# Patient Record
Sex: Female | Born: 1980 | Race: White | Hispanic: No | Marital: Married | Smoking: Never smoker
Health system: Southern US, Community
[De-identification: ages and names within clinical notes are randomized; demographics above are authoritative.]

## PROBLEM LIST (undated history)

## (undated) DIAGNOSIS — T7840XA Allergy, unspecified, initial encounter: Secondary | ICD-10-CM

## (undated) DIAGNOSIS — L239 Allergic contact dermatitis, unspecified cause: Secondary | ICD-10-CM

## (undated) DIAGNOSIS — Z201 Contact with and (suspected) exposure to tuberculosis: Secondary | ICD-10-CM

## (undated) HISTORY — DX: Allergy, unspecified, initial encounter: T78.40XA

## (undated) HISTORY — DX: Contact with and (suspected) exposure to tuberculosis: Z20.1

## (undated) HISTORY — DX: Allergic contact dermatitis, unspecified cause: L23.9

---

## 2007-09-24 ENCOUNTER — Ambulatory Visit: Payer: Self-pay | Admitting: Obstetrics and Gynecology

## 2007-10-01 ENCOUNTER — Encounter: Payer: Self-pay | Admitting: Maternal & Fetal Medicine

## 2007-10-08 ENCOUNTER — Encounter: Payer: Self-pay | Admitting: Maternal & Fetal Medicine

## 2007-10-18 ENCOUNTER — Observation Stay: Payer: Self-pay | Admitting: Obstetrics and Gynecology

## 2007-10-18 ENCOUNTER — Encounter: Payer: Self-pay | Admitting: Maternal & Fetal Medicine

## 2007-10-22 ENCOUNTER — Observation Stay: Payer: Self-pay

## 2007-10-24 ENCOUNTER — Observation Stay: Payer: Self-pay | Admitting: Obstetrics and Gynecology

## 2007-10-27 ENCOUNTER — Observation Stay: Payer: Self-pay

## 2007-10-29 ENCOUNTER — Encounter: Payer: Self-pay | Admitting: Maternal & Fetal Medicine

## 2007-10-31 ENCOUNTER — Observation Stay: Payer: Self-pay

## 2007-11-03 ENCOUNTER — Observation Stay: Payer: Self-pay

## 2007-11-05 ENCOUNTER — Encounter: Payer: Self-pay | Admitting: Maternal & Fetal Medicine

## 2007-11-07 ENCOUNTER — Observation Stay: Payer: Self-pay | Admitting: Obstetrics and Gynecology

## 2007-11-10 ENCOUNTER — Observation Stay: Payer: Self-pay | Admitting: Obstetrics and Gynecology

## 2007-11-14 ENCOUNTER — Observation Stay: Payer: Self-pay

## 2007-11-17 ENCOUNTER — Observation Stay: Payer: Self-pay

## 2007-11-21 ENCOUNTER — Observation Stay: Payer: Self-pay | Admitting: Obstetrics and Gynecology

## 2007-11-24 ENCOUNTER — Inpatient Hospital Stay: Payer: Self-pay | Admitting: Obstetrics and Gynecology

## 2009-09-08 HISTORY — PX: WRIST FRACTURE SURGERY: SHX121

## 2010-06-30 ENCOUNTER — Ambulatory Visit: Payer: Self-pay | Admitting: Obstetrics and Gynecology

## 2010-09-20 ENCOUNTER — Observation Stay: Payer: Self-pay | Admitting: Obstetrics and Gynecology

## 2010-10-11 ENCOUNTER — Inpatient Hospital Stay: Payer: Self-pay

## 2011-06-17 DIAGNOSIS — E039 Hypothyroidism, unspecified: Secondary | ICD-10-CM | POA: Insufficient documentation

## 2011-06-17 DIAGNOSIS — E038 Other specified hypothyroidism: Secondary | ICD-10-CM | POA: Insufficient documentation

## 2011-06-17 DIAGNOSIS — Z8639 Personal history of other endocrine, nutritional and metabolic disease: Secondary | ICD-10-CM | POA: Insufficient documentation

## 2013-01-17 LAB — LIPID PANEL
Cholesterol: 150 mg/dL (ref 0–200)
HDL: 62 mg/dL (ref 35–70)
LDL Cholesterol: 73 mg/dL
TRIGLYCERIDES: 73 mg/dL (ref 40–160)

## 2013-01-18 LAB — HM PAP SMEAR: HM PAP: NORMAL

## 2014-09-15 ENCOUNTER — Ambulatory Visit: Payer: Self-pay | Admitting: Family Medicine

## 2015-03-03 ENCOUNTER — Encounter: Payer: Self-pay | Admitting: Family Medicine

## 2015-03-03 DIAGNOSIS — Z91018 Allergy to other foods: Secondary | ICD-10-CM | POA: Insufficient documentation

## 2015-03-03 DIAGNOSIS — L239 Allergic contact dermatitis, unspecified cause: Secondary | ICD-10-CM | POA: Insufficient documentation

## 2015-03-03 DIAGNOSIS — T7840XA Allergy, unspecified, initial encounter: Secondary | ICD-10-CM | POA: Insufficient documentation

## 2015-03-04 ENCOUNTER — Encounter (INDEPENDENT_AMBULATORY_CARE_PROVIDER_SITE_OTHER): Payer: Self-pay

## 2015-03-04 ENCOUNTER — Encounter: Payer: Self-pay | Admitting: Family Medicine

## 2015-03-04 ENCOUNTER — Ambulatory Visit (INDEPENDENT_AMBULATORY_CARE_PROVIDER_SITE_OTHER): Payer: Managed Care, Other (non HMO) | Admitting: Family Medicine

## 2015-03-04 VITALS — BP 110/60 | HR 105 | Temp 98.1°F | Resp 18 | Ht 62.25 in | Wt 203.7 lb

## 2015-03-04 DIAGNOSIS — I059 Rheumatic mitral valve disease, unspecified: Secondary | ICD-10-CM | POA: Insufficient documentation

## 2015-03-04 DIAGNOSIS — Z789 Other specified health status: Secondary | ICD-10-CM | POA: Diagnosis not present

## 2015-03-04 DIAGNOSIS — E038 Other specified hypothyroidism: Secondary | ICD-10-CM

## 2015-03-04 DIAGNOSIS — Z13220 Encounter for screening for lipoid disorders: Secondary | ICD-10-CM

## 2015-03-04 DIAGNOSIS — Z0000 Encounter for general adult medical examination without abnormal findings: Secondary | ICD-10-CM

## 2015-03-04 DIAGNOSIS — Z0282 Encounter for adoption services: Secondary | ICD-10-CM

## 2015-03-04 DIAGNOSIS — Z8639 Personal history of other endocrine, nutritional and metabolic disease: Secondary | ICD-10-CM

## 2015-03-04 DIAGNOSIS — Z124 Encounter for screening for malignant neoplasm of cervix: Secondary | ICD-10-CM | POA: Diagnosis not present

## 2015-03-04 DIAGNOSIS — Z7681 Expectant parent(s) prebirth pediatrician visit: Secondary | ICD-10-CM | POA: Diagnosis not present

## 2015-03-04 DIAGNOSIS — E039 Hypothyroidism, unspecified: Secondary | ICD-10-CM

## 2015-03-04 LAB — POCT URINALYSIS DIPSTICK
Bilirubin, UA: NEGATIVE
Blood, UA: NEGATIVE
Glucose, UA: NEGATIVE
Ketones, UA: NEGATIVE
LEUKOCYTES UA: NEGATIVE
NITRITE UA: NEGATIVE
PROTEIN UA: NEGATIVE
Spec Grav, UA: 1.01
Urobilinogen, UA: 0.2
pH, UA: 5.5

## 2015-03-04 NOTE — Progress Notes (Signed)
Name: Cynthia Villa   MRN: [id number]    DOB: Mar 30, 1981   Date:03/04/2015       Progress Note  Subjective  Chief Complaint  Chief Complaint  Patient presents with  . Annual Exam    HPI  Well Woman: she has gained weight, more stressed at work, getting ready to adopt another child and needs paperwork done.   Patient Active Problem List   Diagnosis Date Noted  . 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 History of thyroid nodule 03/04/2015  . Allergic contact dermatitis 03/03/2015  . Allergic state 03/03/2015  . Allergic to food 03/03/2015  . History of hyperthyroidism 06/17/2011  . Subclinical hypothyroidism 06/17/2011    Past Surgical History  Procedure Laterality Date  . Wrist fracture surgery Left 09/08/09    Repaired- August 2004    Family History  Problem Relation Age of Onset  . Diabetes Mother     DM-2nd to chronic pancreatitis  . Alcohol abuse Mother   . Cancer Mother     Breast  . Cholelithiasis Mother   . Sleep apnea Father   . Hypertension Father   . PKU Son   . Polycystic ovary syndrome Sister   . Cholelithiasis Sister     History   Social History  . Marital Status: Married    Spouse Name: N/A  . Number of Children: N/A  . Years of Education: N/A   Occupational History  . Not on file.   Social History Main Topics  . Smoking status: Never Smoker   . Smokeless tobacco: Never Used  . Alcohol Use: No  . Drug Use: No  . Sexual Activity:    Partners: Male   Other Topics Concern  . Not on file   Social History Narrative  . No narrative on file     Current outpatient prescriptions:  .  albuterol (ACCUNEB) 0.63 MG/3ML nebulizer solution, Inhale into the lungs as needed., Disp: , Rfl:  .  Albuterol Sulfate (PROAIR RESPICLICK) 108 (90 BASE) MCG/ACT AEPB, , Disp: , Rfl:  .  fluticasone (FLONASE) 50 MCG/ACT nasal spray, Place 2 sprays into the nose as needed., Disp: , Rfl:  .  levocetirizine (XYZAL) 5 MG tablet, Take 1 tablet by mouth daily., Disp:  , Rfl: 3 .  PARAGARD INTRAUTERINE COPPER IUD IUD, by Intrauterine route., Disp: , Rfl:  .  ranitidine (ZANTAC) 150 MG tablet, Take 1 tablet by mouth 2 (two) times daily., Disp: , Rfl: 3  Allergies  Allergen Reactions  . Cetirizine Other (See Comments)    Other Reaction: CNS Disorder  . Monistat  [Miconazole] Nausea And Vomiting  . Orange Fruit [Citrus] Swelling  . Penicillins Hives  . Sudafed  [Pseudoephedrine]     Other reaction(s): Muscle Pain  . Banana Rash  . Pineapple Rash     ROS  Constitutional: Negative for fever / positive for weight change - gain.  Respiratory: Negative for cough and shortness of breath.   Cardiovascular: Negative for chest pain or palpitations.  Gastrointestinal: Negative for abdominal pain, no bowel changes.  Musculoskeletal: Negative for gait problem or joint swelling.  Skin: Positive  for rash.  Neurological: Negative for dizziness or headache.  No other specific complaints in a complete review of systems (except as listed in HPI above).  Objective  Filed Vitals:   03/04/15 1148  BP: 110/60  Pulse: 105  Temp: 98.1 F (36.7 C)  TempSrc: Oral  Resp: 18  Height: 5' 2.25" (1.581 m)  Weight: 203  lb 11.2 oz (92.398 kg)  SpO2: 96%    Body mass index is 36.97 kg/(m^2).  Physical Exam  Constitutional: Patient appears well-developed and well-nourished. No distress. Obese HENT: Head: Normocephalic and atraumatic. Ears: B TMs ok, no erythema or effusion; Nose: Nose normal. Mouth/Throat: Oropharynx is clear and moist. No oropharyngeal exudate.  Eyes: Conjunctivae and EOM are normal. Pupils are equal, round, and reactive to light. No scleral icterus.  Neck: Normal range of motion. Neck supple. No JVD present. No thyromegaly present.  Cardiovascular: Normal rate, regular rhythm and normal heart sounds.  No murmur heard. No BLE edema. Pulmonary/Chest: Effort normal and breath sounds normal. No respiratory distress. Abdominal: Soft. Bowel sounds are  normal, no distension. There is no tenderness. no masses Breast: no lumps or masses, no nipple discharge or rashes FEMALE GENITALIA:  External genitalia normal External urethra normal Vaginal vault normal without discharge or lesions Cervix normal without discharge or lesions Bimanual exam normal without masses RECTAL: no rectal masses or hemorrhoids Musculoskeletal: Normal range of motion, no joint effusions. No gross deformities Neurological: he is alert and oriented to person, place, and time. No cranial nerve deficit. Coordination, balance, strength, speech and gait are normal.  Skin: Skin is warm and dry. No rash noted. No erythema.  Psychiatric: Patient has a normal mood and affect. behavior is normal. Judgment and thought content normal.  PHQ2/9: Depression screen PHQ 2/9 03/04/2015  Decreased Interest 0  Down, Depressed, Hopeless 0  PHQ - 2 Score 0     Fall Risk: Fall Risk  03/04/2015  Falls in the past year? Yes  Number falls in past yr: 1  Injury with Fall? No     Assessment & Plan  1. Well adult exam  Discussed importance of 150 minutes of physical activity weekly, she is a vegetarian - but is willing to try fish again, eat one serving of tree nuts ( cashews, pistachios, pecans, almonds.Marland Kitchen.) every other day, eat 6 servings of fruit/vegetables daily and drink plenty of water and avoid sweet beverages.   2. Pre-adoption visit for adoptive parent(s)  - HIV antibody (with reflex) - Comprehensive Metabolic Panel (CMET) - CBC with Differential  3. History of hyperthyroidism Recheck labs TSH   4. Screening for cervical cancer  - Cytology - PAP  5. Subclinical hypothyroidism  - Thyroglobulin antibody  6. Lipid screening  - Lipid Profile

## 2015-03-05 ENCOUNTER — Other Ambulatory Visit: Payer: Self-pay | Admitting: Family Medicine

## 2015-03-05 DIAGNOSIS — R635 Abnormal weight gain: Secondary | ICD-10-CM

## 2015-03-05 LAB — COMPREHENSIVE METABOLIC PANEL
A/G RATIO: 1.7 (ref 1.1–2.5)
ALT: 18 IU/L (ref 0–32)
AST: 21 IU/L (ref 0–40)
Albumin: 4.3 g/dL (ref 3.5–5.5)
Alkaline Phosphatase: 56 IU/L (ref 39–117)
BUN / CREAT RATIO: 9 (ref 8–20)
BUN: 9 mg/dL (ref 6–20)
Bilirubin Total: 0.3 mg/dL (ref 0.0–1.2)
CO2: 22 mmol/L (ref 18–29)
CREATININE: 0.98 mg/dL (ref 0.57–1.00)
Calcium: 9.1 mg/dL (ref 8.7–10.2)
Chloride: 101 mmol/L (ref 97–108)
GFR calc Af Amer: 88 mL/min/{1.73_m2} (ref >59)
GFR, EST NON AFRICAN AMERICAN: 76 mL/min/{1.73_m2} (ref >59)
Globulin, Total: 2.5 g/dL (ref 1.5–4.5)
Glucose: 89 mg/dL (ref 65–99)
Potassium: 4.3 mmol/L (ref 3.5–5.2)
Sodium: 142 mmol/L (ref 134–144)
TOTAL PROTEIN: 6.8 g/dL (ref 6.0–8.5)

## 2015-03-05 LAB — CBC WITH DIFFERENTIAL/PLATELET
BASOS: 1 %
Basophils Absolute: 0.1 10*3/uL (ref 0.0–0.2)
EOS (ABSOLUTE): 0.2 10*3/uL (ref 0.0–0.4)
Eos: 2 %
HEMATOCRIT: 38.7 % (ref 34.0–46.6)
Hemoglobin: 12.5 g/dL (ref 11.1–15.9)
Immature Grans (Abs): 0 10*3/uL (ref 0.0–0.1)
Immature Granulocytes: 0 %
LYMPHS: 24 %
Lymphocytes Absolute: 2 10*3/uL (ref 0.7–3.1)
MCH: 27.5 pg (ref 26.6–33.0)
MCHC: 32.3 g/dL (ref 31.5–35.7)
MCV: 85 fL (ref 79–97)
Monocytes Absolute: 0.6 10*3/uL (ref 0.1–0.9)
Monocytes: 7 %
NEUTROS ABS: 5.7 10*3/uL (ref 1.4–7.0)
Neutrophils: 66 %
PLATELETS: 289 10*3/uL (ref 150–379)
RBC: 4.55 x10E6/uL (ref 3.77–5.28)
RDW: 16 % — ABNORMAL HIGH (ref 12.3–15.4)
WBC: 8.5 10*3/uL (ref 3.4–10.8)

## 2015-03-05 LAB — LIPID PANEL
CHOL/HDL RATIO: 3.4 ratio (ref 0.0–4.4)
CHOLESTEROL TOTAL: 188 mg/dL (ref 100–199)
HDL: 55 mg/dL (ref >39)
LDL CALC: 110 mg/dL — AB (ref 0–99)
Triglycerides: 117 mg/dL (ref 0–149)
VLDL CHOLESTEROL CAL: 23 mg/dL (ref 5–40)

## 2015-03-05 LAB — THYROGLOBULIN ANTIBODY

## 2015-03-05 LAB — HIV ANTIBODY (ROUTINE TESTING W REFLEX): HIV Screen 4th Generation wRfx: NONREACTIVE

## 2015-03-06 LAB — SPECIMEN STATUS REPORT

## 2015-03-06 LAB — TSH: TSH: 1.04 u[IU]/mL (ref 0.450–4.500)

## 2015-03-11 LAB — PAP LB, HPV-H+LR
HPV DNA HIGH RISK: NEGATIVE
HPV DNA Low Risk: NEGATIVE
PAP Smear Comment: 0

## 2015-03-18 NOTE — Progress Notes (Signed)
Patient notified

## 2015-05-04 ENCOUNTER — Encounter: Payer: Self-pay | Admitting: Family Medicine

## 2015-05-04 ENCOUNTER — Ambulatory Visit (INDEPENDENT_AMBULATORY_CARE_PROVIDER_SITE_OTHER): Payer: Managed Care, Other (non HMO) | Admitting: Family Medicine

## 2015-05-04 ENCOUNTER — Encounter (INDEPENDENT_AMBULATORY_CARE_PROVIDER_SITE_OTHER): Payer: Self-pay

## 2015-05-04 VITALS — BP 132/86 | HR 123 | Temp 97.7°F | Resp 14 | Ht 62.0 in | Wt 204.4 lb

## 2015-05-04 DIAGNOSIS — B852 Pediculosis, unspecified: Secondary | ICD-10-CM

## 2015-05-04 DIAGNOSIS — L239 Allergic contact dermatitis, unspecified cause: Secondary | ICD-10-CM | POA: Diagnosis not present

## 2015-05-04 DIAGNOSIS — L089 Local infection of the skin and subcutaneous tissue, unspecified: Secondary | ICD-10-CM

## 2015-05-04 MED ORDER — CLOBETASOL PROPIONATE 0.05 % EX LIQD: Freq: Two times a day (BID) | CUTANEOUS | Status: DC

## 2015-05-04 MED ORDER — TRIAMCINOLONE ACETONIDE 0.1 % EX CREA: 1.0000 "application " | TOPICAL_CREAM | Freq: Two times a day (BID) | CUTANEOUS | Status: DC

## 2015-05-04 MED ORDER — PREDNISONE 10 MG (21) PO TBPK: 10.0000 mg | ORAL_TABLET | Freq: Every day | ORAL | Status: DC

## 2015-05-04 MED ORDER — DOXYCYCLINE HYCLATE 100 MG PO TABS: 100.0000 mg | ORAL_TABLET | Freq: Two times a day (BID) | ORAL | Status: DC

## 2015-05-04 MED ORDER — MALATHION 0.5 % EX LOTN: TOPICAL_LOTION | Freq: Once | CUTANEOUS | Status: DC

## 2015-05-04 NOTE — Progress Notes (Signed)
Name: Cynthia Villa   MRN: [id number]    DOB: 1981-02-20   Date:05/04/2015       Progress Note  Subjective  Chief Complaint  Chief Complaint  Patient presents with  . Rash    bilateral hands,arms and face.  onset 1 week worsening.  went to urgent care last week and was given steriod shot and cream.  Gave herself a lice treatment and this flared up after     HPI  Eczema: she has severe atopic eczema, she was exposed to lice at work and had to be treated with [place] otc and developed acute worsening of eczema with swelling, redness, increase in itching on her hands, arms and also scalp.  She went to Urgent Care and was given topical antibiotic ointment, and one shot os steroid. She states improved for a few days and is bad again. Feels like her skin is burned.    Patient Active Problem List   Diagnosis Date Noted  . 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 History of thyroid nodule 03/04/2015  . Allergic contact dermatitis 03/03/2015  . Allergic state 03/03/2015  . Allergic to food 03/03/2015  . History of hyperthyroidism 06/17/2011  . Subclinical hypothyroidism 06/17/2011    Past Surgical History  Procedure Laterality Date  . Wrist fracture surgery Left 09/08/09    Repaired- August 2004    Family History  Problem Relation Age of Onset  . Diabetes Mother     DM-2nd to chronic pancreatitis  . Alcohol abuse Mother   . Cancer Mother     Breast  . Cholelithiasis Mother   . Sleep apnea Father   . Hypertension Father   . PKU Son   . Polycystic ovary syndrome Sister   . Cholelithiasis Sister     Social History   Social History  . Marital Status: Married    Spouse Name: N/A  . Number of Children: N/A  . Years of Education: N/A   Occupational History  . Not on file.   Social History Main Topics  . Smoking status: Never Smoker   . Smokeless tobacco: Never Used  . Alcohol Use: No  . Drug Use: No  . Sexual Activity:    Partners: Male   Other Topics Concern  . Not on  file   Social History Narrative     Current outpatient prescriptions:  .  montelukast (SINGULAIR) 10 MG tablet, Take 10 mg by mouth at bedtime., Disp: , Rfl:  .  albuterol (ACCUNEB) 0.63 MG/3ML nebulizer solution, Inhale into the lungs as needed., Disp: , Rfl:  .  Albuterol Sulfate (PROAIR RESPICLICK) 108 (90 BASE) MCG/ACT AEPB, , Disp: , Rfl:  .  Clobetasol Propionate (TEMOVATE) 0.05 % external spray, Apply topically 2 (two) times daily., Disp: 59 mL, Rfl: 2 .  doxycycline (VIBRA-TABS) 100 MG tablet, Take 1 tablet (100 mg total) by mouth 2 (two) times daily., Disp: 20 tablet, Rfl: 0 .  fluticasone (FLONASE) 50 MCG/ACT nasal spray, Place 2 sprays into the nose as needed., Disp: , Rfl:  .  hydrOXYzine (ATARAX/VISTARIL) 25 MG tablet, Take 1 tablet by mouth 2 (two) times daily., Disp: , Rfl: 3 .  levocetirizine (XYZAL) 5 MG tablet, Take 1 tablet by mouth daily., Disp: , Rfl: 3 .  malathion (OVIDE) 0.5 % lotion, Apply topically once. Sprinkle lotion on dry hair and rub gently until the scalp is thoroughly moistened. Allow to dry naturally and leave uncovered., Disp: 59 mL, Rfl: 0 .  PARAGARD INTRAUTERINE COPPER  IUD IUD, by Intrauterine route., Disp: , Rfl:  .  predniSONE (STERAPRED UNI-PAK 21 TAB) 10 MG (21) TBPK tablet, Take 1 tablet (10 mg total) by mouth daily., Disp: 21 tablet, Rfl: 0 .  ranitidine (ZANTAC) 150 MG tablet, Take 1 tablet by mouth 2 (two) times daily., Disp: , Rfl: 3 .  triamcinolone cream (KENALOG) 0.1 %, Apply 1 application topically 2 (two) times daily., Disp: 80 g, Rfl: 2  Allergies  Allergen Reactions  . Cetirizine Other (See Comments)    Other Reaction: CNS Disorder  . Monistat  [Miconazole] Nausea And Vomiting  . Orange Fruit [Citrus] Swelling  . Penicillins Hives  . Sudafed  [Pseudoephedrine]     Other reaction(s): Muscle Pain  . Banana Rash  . Pineapple Rash     ROS   Constitutional: Negative for fever or weight change.  Respiratory: Negative for cough and  shortness of breath.   Cardiovascular: Negative for chest pain or palpitations.  Gastrointestinal: Negative for abdominal pain, no bowel changes.  Musculoskeletal: Negative for gait problem or joint swelling.  Skin:Positive  for rash.  Neurological: Negative for dizziness or headache.  No other specific complaints in a complete review of systems (except as listed in HPI above).   Objective  Filed Vitals:   05/04/15 1358  BP: 132/86  Pulse: 123  Temp: 97.7 F (36.5 C)  TempSrc: Oral  Resp: 14  Height:  (1.575 m)  Weight: 204 lb 6.4 oz (92.715 kg)  SpO2: 98%    Body mass index is 37.38 kg/(m^2).  Physical Exam  Constitutional: Patient appears well-developed and well-nourished. Obese  No distress.  HEENT: head atraumatic, normocephalic, pupils equal and reactive to light, neck supple, throat within normal limits Cardiovascular: Normal rate, regular rhythm and normal heart sounds.  No murmur heard. No BLE edema. Pulmonary/Chest: Effort normal and breath sounds normal. No respiratory distress. Abdominal: Soft.  There is no tenderness. Psychiatric: Patient has a normal mood and affect. behavior is normal. Judgment and thought content normal. Skin: dry eczematous patches on antecubital area and hands, also has some fingers that are very red, oozing, and some honey like drainage.   Recent Results (from the past 2160 hour(s))  Pap Lb, HPV-h+lr     Status: None   Collection Time: 03/04/15 12:00 AM  Result Value Ref Range   DIAGNOSIS: Comment     Comment: NEGATIVE FOR INTRAEPITHELIAL LESION AND MALIGNANCY.   Specimen adequacy: Comment     Comment: Satisfactory for evaluation. Endocervical and/or squamous metaplastic cells (endocervical component) are present.    Performed by: Comment     Comment: Dorothy Spark, Supervisory Cytotechnologist (ASCP)   PAP SMEAR COMMENT .    Note: Comment     Comment: The Pap smear is a screening test designed to aid in the detection  of premalignant and malignant conditions of the uterine cervix.  It is not a diagnostic procedure and should not be used as the sole means of detecting cervical cancer.  Both false-positive and false-negative reports do occur.    HPV DNA High Risk Negative Negative   HPV DNA Low Risk Negative Negative    Comment: These HPV tests detect thirteen high-risk types (16/18/31/33/35/ 39/45/51/52/56/58/59/68) and five low-risk types (01/23/41/43/44) without differentiation.   POCT urinalysis dipstick     Status: Normal   Collection Time: 03/04/15 12:49 PM  Result Value Ref Range   Color, UA yellow    Clarity, UA clear    Glucose, UA neg  Bilirubin, UA neg    Ketones, UA neg    Spec Grav, UA 1.010    Blood, UA neg    pH, UA 5.5    Protein, UA neg    Urobilinogen, UA 0.2    Nitrite, UA neg    Leukocytes, UA Negative Negative  HIV antibody (with reflex)     Status: None   Collection Time: 03/04/15 12:52 PM  Result Value Ref Range   HIV Screen 4th Generation wRfx Non Reactive Non Reactive  Comprehensive Metabolic Panel (CMET)     Status: None   Collection Time: 03/04/15 12:52 PM  Result Value Ref Range   Glucose 89 65 - 99 mg/dL   BUN 9 6 - 20 mg/dL   Creatinine, Ser 1.61 0.57 - 1.00 mg/dL   GFR calc non Af Amer 76 >59 mL/min/1.73   GFR calc Af Amer 88 >59 mL/min/1.73   BUN/Creatinine Ratio 9 8 - 20   Sodium 142 134 - 144 mmol/L   Potassium 4.3 3.5 - 5.2 mmol/L   Chloride 101 97 - 108 mmol/L   CO2 22 18 - 29 mmol/L   Calcium 9.1 8.7 - 10.2 mg/dL   Total Protein 6.8 6.0 - 8.5 g/dL   Albumin 4.3 3.5 - 5.5 g/dL   Globulin, Total 2.5 1.5 - 4.5 g/dL   Albumin/Globulin Ratio 1.7 1.1 - 2.5   Bilirubin Total 0.3 0.0 - 1.2 mg/dL   Alkaline Phosphatase 56 39 - 117 IU/L   AST 21 0 - 40 IU/L   ALT 18 0 - 32 IU/L  CBC with Differential     Status: Abnormal   Collection Time: 03/04/15 12:52 PM  Result Value Ref Range   WBC 8.5 3.4 - 10.8 x10E3/uL   RBC 4.55 3.77 - 5.28 x10E6/uL    Hemoglobin 12.5 11.1 - 15.9 g/dL   Hematocrit 09.6 04.5 - 46.6 %   MCV 85 79 - 97 fL   MCH 27.5 26.6 - 33.0 pg   MCHC 32.3 31.5 - 35.7 g/dL   RDW 40.9 (H) 81.1 - 91.4 %   Platelets 289 150 - 379 x10E3/uL   Neutrophils 66 %   Lymphs 24 %   Monocytes 7 %   Eos 2 %   Basos 1 %   Neutrophils Absolute 5.7 1.4 - 7.0 x10E3/uL   Lymphocytes Absolute 2.0 0.7 - 3.1 x10E3/uL   Monocytes Absolute 0.6 0.1 - 0.9 x10E3/uL   EOS (ABSOLUTE) 0.2 0.0 - 0.4 x10E3/uL   Basophils Absolute 0.1 0.0 - 0.2 x10E3/uL   Immature Granulocytes 0 %   Immature Grans (Abs) 0.0 0.0 - 0.1 x10E3/uL  Lipid Profile     Status: Abnormal   Collection Time: 03/04/15 12:52 PM  Result Value Ref Range   Cholesterol, Total 188 100 - 199 mg/dL   Triglycerides 782 0 - 149 mg/dL   HDL 55 >95 mg/dL    Comment: According to ATP-III Guidelines, HDL-C >59 mg/dL is considered a negative risk factor for CHD.    VLDL Cholesterol Cal 23 5 - 40 mg/dL   LDL Calculated 621 (H) 0 - 99 mg/dL   Chol/HDL Ratio 3.4 0.0 - 4.4 ratio units    Comment:                                   T. Chol/HDL Ratio  Men  Women                               1/2 Avg.Risk  3.4    3.3                                   Avg.Risk  5.0    4.4                                2X Avg.Risk  9.6    7.1                                3X Avg.Risk 23.4   11.0   Thyroglobulin antibody     Status: None   Collection Time: 03/04/15 12:52 PM  Result Value Ref Range   Thyroglobulin Antibody <1.0 0.0 - 0.9 IU/mL    Comment: Thyroglobulin Antibody measured by Beckman Coulter Methodology  TSH     Status: None   Collection Time: 03/04/15 12:52 PM  Result Value Ref Range   TSH 1.040 0.450 - 4.500 uIU/mL  Specimen status report     Status: None   Collection Time: 03/04/15 12:52 PM  Result Value Ref Range   specimen status report Comment     Comment: Written Authorization Written Authorization Written Authorization  Received. Authorization received from [facility] 03-06-2015 Logged by PATRICIA  POOLE      PHQ2/9: Depression screen [facility] 2/9 05/04/2015 03/04/2015  Decreased Interest 0 0  Down, Depressed, Hopeless 0 0  PHQ - 2 Score 0 0     Fall Risk: Fall Risk  05/04/2015 03/04/2015  Falls in the past year? No Yes  Number falls in past yr: - 1  Injury with Fall? - No     Functional Status Survey: Is the patient deaf or have difficulty hearing?: No Does the patient have difficulty seeing, even when wearing glasses/contacts?: Yes (contacts) Does the patient have difficulty concentrating, remembering, or making decisions?: No Does the patient have difficulty walking or climbing stairs?: No Does the patient have difficulty dressing or bathing?: No Does the patient have difficulty doing errands alone such as visiting a doctor's office or shopping?: No    Assessment & Plan  1. Allergic contact dermatitis  - Clobetasol Propionate (TEMOVATE) 0.05 % external spray; Apply topically 2 (two) times daily.  Dispense: 59 mL; Refill: 2 - triamcinolone cream (KENALOG) 0.1 %; Apply 1 application topically 2 (two) times daily.  Dispense: 80 g; Refill: 2 - predniSONE (STERAPRED UNI-PAK 21 TAB) 10 MG (21) TBPK tablet; Take 1 tablet (10 mg total) by mouth daily.  Dispense: 21 tablet; Refill: 0  2. Skin infection  Possible streptococcal infection - doxycycline (VIBRA-TABS) 100 MG tablet; Take 1 tablet (100 mg total) by mouth 2 (two) times daily.  Dispense: 20 tablet; Refill: 0  3. Lice I will give her Ovide to use if needed , no lice seen at this time - malathion (OVIDE) 0.5 % lotion; Apply topically once. Sprinkle lotion on dry hair and rub gently until the scalp is thoroughly moistened. Allow to dry naturally and leave uncovered.  Dispense: 59 mL; Refill: 0

## 2015-05-13 ENCOUNTER — Telehealth: Payer: Self-pay

## 2015-05-13 DIAGNOSIS — L239 Allergic contact dermatitis, unspecified cause: Secondary | ICD-10-CM

## 2015-05-13 MED ORDER — PREDNISONE 10 MG PO TABS: 10.0000 mg | ORAL_TABLET | Freq: Every day | ORAL | Status: DC

## 2015-05-13 NOTE — Telephone Encounter (Signed)
Left message on house phone due to cell phone mailbox was full and unable to leave a message.

## 2015-05-13 NOTE — Telephone Encounter (Signed)
Patient came in to see Dr. Carlynn Purl last week due to having a outbreak of hives and eczema, and now since she has finished the steroid medication on Friday or Saturday her symptoms have worsen.  Now patient states she is having hives on both arms and big clusters of red bumps on her right hand. Could you please call in another medication. 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

## 2015-05-13 NOTE — Telephone Encounter (Signed)
I sent prednisone for her to take one daily for about 10 days and wean to half pill until finished, needs to follow up with Dermatologist

## 2015-06-09 ENCOUNTER — Telehealth: Payer: Self-pay | Admitting: Family Medicine

## 2015-06-09 MED ORDER — CIPROFLOXACIN HCL 0.3 % OP SOLN: 1.0000 [drp] | OPHTHALMIC | Status: DC

## 2015-06-09 NOTE — Telephone Encounter (Signed)
Left voicemail, and to call back if not better or having vision problems

## 2015-06-09 NOTE — Telephone Encounter (Signed)
Patient woke up with pink eye in both eyes and would like to know if you are able to call something in to [facility]. Please leave a detailed message [phone]

## 2015-06-09 NOTE — Telephone Encounter (Signed)
Forwarded to Dr. Sowles  

## 2015-06-09 NOTE — Telephone Encounter (Signed)
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  Please make sure she is not having visual disturbance or headaches, if symptoms don't improve will need to be seen tomorrow. I will send antibiotics

## 2015-08-03 ENCOUNTER — Ambulatory Visit (INDEPENDENT_AMBULATORY_CARE_PROVIDER_SITE_OTHER): Payer: Managed Care, Other (non HMO) | Admitting: Family Medicine

## 2015-08-03 ENCOUNTER — Encounter: Payer: Self-pay | Admitting: Family Medicine

## 2015-08-03 ENCOUNTER — Telehealth: Payer: Self-pay

## 2015-08-03 VITALS — BP 114/82 | HR 113 | Temp 98.5°F | Resp 18 | Ht 62.0 in | Wt 207.6 lb

## 2015-08-03 DIAGNOSIS — Z789 Other specified health status: Secondary | ICD-10-CM | POA: Diagnosis not present

## 2015-08-03 DIAGNOSIS — L509 Urticaria, unspecified: Secondary | ICD-10-CM

## 2015-08-03 DIAGNOSIS — L239 Allergic contact dermatitis, unspecified cause: Secondary | ICD-10-CM

## 2015-08-03 DIAGNOSIS — E038 Other specified hypothyroidism: Secondary | ICD-10-CM

## 2015-08-03 DIAGNOSIS — Z23 Encounter for immunization: Secondary | ICD-10-CM | POA: Diagnosis not present

## 2015-08-03 DIAGNOSIS — D538 Other specified nutritional anemias: Secondary | ICD-10-CM | POA: Diagnosis not present

## 2015-08-03 DIAGNOSIS — E039 Hypothyroidism, unspecified: Secondary | ICD-10-CM

## 2015-08-03 MED ORDER — RANITIDINE HCL 150 MG PO TABS: 150.0000 mg | ORAL_TABLET | Freq: Two times a day (BID) | ORAL | Status: DC

## 2015-08-03 MED ORDER — TRIAMCINOLONE ACETONIDE 0.1 % EX CREA: 1.0000 "application " | TOPICAL_CREAM | Freq: Two times a day (BID) | CUTANEOUS | Status: DC

## 2015-08-03 MED ORDER — LEVOCETIRIZINE DIHYDROCHLORIDE 5 MG PO TABS: 5.0000 mg | ORAL_TABLET | Freq: Every day | ORAL | Status: AC

## 2015-08-03 MED ORDER — MONTELUKAST SODIUM 10 MG PO TABS: 10.0000 mg | ORAL_TABLET | Freq: Every day | ORAL | Status: DC

## 2015-08-03 MED ORDER — HYDROXYZINE HCL 25 MG PO TABS: 25.0000 mg | ORAL_TABLET | Freq: Two times a day (BID) | ORAL | Status: DC

## 2015-08-03 MED ORDER — PREDNISONE 5 MG (48) PO TBPK: 5.0000 mg | ORAL_TABLET | Freq: Every day | ORAL | Status: DC

## 2015-08-03 MED ORDER — FERROUS SULFATE 325 (65 FE) MG PO TABS: 325.0000 mg | ORAL_TABLET | Freq: Every day | ORAL | Status: DC

## 2015-08-03 MED ORDER — CLOBETASOL PROPIONATE 0.05 % EX LIQD: Freq: Two times a day (BID) | CUTANEOUS | Status: DC

## 2015-08-03 MED ORDER — PIMECROLIMUS 1 % EX CREA: TOPICAL_CREAM | Freq: Two times a day (BID) | CUTANEOUS | Status: DC

## 2015-08-03 NOTE — Telephone Encounter (Signed)
Patient needs a 6 month F/U Appointment, can you please call and schedule.

## 2015-08-03 NOTE — Progress Notes (Signed)
Name: Cynthia Villa   MRN: [id number]    DOB: 1980-12-04   Date:08/03/2015       Progress Note  Subjective  Chief Complaint  Chief Complaint  Patient presents with  . Medication Refill    3 month F/U  . Asthma    Allergy Induced Asthma-Well controlled  . Allergic Rhinitis     Has been very congested   . Rash    Onset-1 week, Started at abdominal area and now has spread to bilateral arms, back. Red, itchy, raised, bumpy and has been putting ointment on them with no relief.   . Anemia    [facility] and was unable to donate due to being anemia. Has been very fatigue and has been doubling up on multi-vitamins.    HPI  Asthma: only on singulair, no wheezing, cough or SOB.  Doing well at this time  AR: she has mild nasal congestion, no rhinorrhea, or sneezing.   Contact Dermatitis: she has severe eczema, but was stable up to one week ago when she developed a new rash ( worse on arms and abdomen, waxes and wanes. She is using medications.   Anemia: she went to [facility] and was told she was anemic, her cycle was at the end of November and she is a vegetarian. She started to take MVI with iron and is feeling less tired now.  Rash: she developed a very itchy rash on trunk, inner arms and popliteal fossa that waxes and wanes, no burrows, nobody else at home has the same symptoms. No recent travel. She has been eating different types of food - because of the holidays also changed shampoo and was using vaseline on her body - which she usually does not use it.   Patient Active Problem List   Diagnosis Date Noted  . 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 History of thyroid nodule 03/04/2015  . Allergic contact dermatitis 03/03/2015  . Allergic state 03/03/2015  . Allergic to food 03/03/2015  . History of hyperthyroidism 06/17/2011  . Subclinical hypothyroidism 06/17/2011    Past Surgical History  Procedure Laterality Date  . Wrist fracture surgery Left 09/08/09     Repaired- August 2004    Family History  Problem Relation Age of Onset  . Diabetes Mother     DM-2nd to chronic pancreatitis  . Alcohol abuse Mother   . Cancer Mother     Breast  . Cholelithiasis Mother   . Sleep apnea Father   . Hypertension Father   . PKU Son   . Polycystic ovary syndrome Sister   . Cholelithiasis Sister     Social History   Social History  . Marital Status: Married    Spouse Name: N/A  . Number of Children: N/A  . Years of Education: N/A   Occupational History  . Not on file.   Social History Main Topics  . Smoking status: Never Smoker   . Smokeless tobacco: Never Used  . Alcohol Use: No  . Drug Use: No  . Sexual Activity:    Partners: Male   Other Topics Concern  . Not on file   Social History Narrative     Current outpatient prescriptions:  .  albuterol (ACCUNEB) 0.63 MG/3ML nebulizer solution, Inhale into the lungs as needed., Disp: , Rfl:  .  Clobetasol Propionate (TEMOVATE) 0.05 % external spray, Apply topically 2 (two) times daily., Disp: 59 mL, Rfl: 2 .  fluticasone (FLONASE) 50 MCG/ACT  nasal spray, Place 2 sprays into the nose as needed., Disp: , Rfl:  .  hydrOXYzine (ATARAX/VISTARIL) 25 MG tablet, Take 1 tablet (25 mg total) by mouth 2 (two) times daily., Disp: 30 tablet, Rfl: 3 .  levocetirizine (XYZAL) 5 MG tablet, Take 1 tablet (5 mg total) by mouth daily., Disp: 30 tablet, Rfl: 3 .  montelukast (SINGULAIR) 10 MG tablet, Take 1 tablet (10 mg total) by mouth at bedtime., Disp: 30 tablet, Rfl: 3 .  PARAGARD INTRAUTERINE COPPER IUD IUD, by Intrauterine route., Disp: , Rfl:  .  pimecrolimus (ELIDEL) 1 % cream, Apply topically 2 (two) times daily., Disp: 100 g, Rfl: 3 .  predniSONE (STERAPRED UNI-PAK 48 TAB) 5 MG (48) TBPK tablet, Take 1 tablet (5 mg total) by mouth daily., Disp: 48 tablet, Rfl: 0 .  ranitidine (ZANTAC) 150 MG tablet, Take 1 tablet (150 mg total) by mouth 2 (two) times daily., Disp: 60 tablet, Rfl: 3 .   triamcinolone cream (KENALOG) 0.1 %, Apply 1 application topically 2 (two) times daily., Disp: 80 g, Rfl: 2  Allergies  Allergen Reactions  . Cetirizine Other (See Comments)    Other Reaction: CNS Disorder  . Monistat  [Miconazole] Nausea And Vomiting  . Orange Fruit [Citrus] Swelling  . Penicillins Hives  . Permethrin     rash  . Sudafed  [Pseudoephedrine]     Other reaction(s): Muscle Pain  . Banana Rash  . Pineapple Rash     ROS  Constitutional: Negative for fever or significant weight change.  Respiratory: Negative for cough and shortness of breath.   Cardiovascular: Negative for chest pain or palpitations.  Gastrointestinal: Negative for abdominal pain, no bowel changes.  Musculoskeletal: Negative for gait problem or joint swelling.  Skin: Positive  for rash.  Neurological: Negative for dizziness or headache.  No other specific complaints in a complete review of systems (except as listed in HPI above).  Objective  Filed Vitals:   08/03/15 1624  BP: 114/82  Pulse: 113  Temp: 98.5 F (36.9 C)  TempSrc: Oral  Resp: 18  Height:  (1.575 m)  Weight: 207 lb 9.6 oz (94.167 kg)  SpO2: 99%    Body mass index is 37.96 kg/(m^2).  Physical Exam  Constitutional: Patient appears well-developed and well-nourished. Obese  No distress.  HEENT: head atraumatic, normocephalic, pupils equal and reactive to light, neck supple, throat within normal limits Cardiovascular: Normal rate, regular rhythm and normal heart sounds.  No murmur heard. No BLE edema. Pulmonary/Chest: Effort normal and breath sounds normal. No respiratory distress. Abdominal: Soft.  There is no tenderness. Psychiatric: Patient has a normal mood and affect. behavior is normal. Judgment and thought content normal. Skin: hives, also eczematous patches, worse on hands.   PHQ2/9: Depression screen [facility] 2/9 08/03/2015 05/04/2015 03/04/2015  Decreased Interest 0 0 0  Down, Depressed, Hopeless 0 0 0  PHQ - 2  Score 0 0 0     Fall Risk: Fall Risk  08/03/2015 05/04/2015 03/04/2015  Falls in the past year? No No Yes  Number falls in past yr: - - 1  Injury with Fall? - - No      Functional Status Survey: Is the patient deaf or have difficulty hearing?: No Does the patient have difficulty seeing, even when wearing glasses/contacts?: Yes (glasses) Does the patient have difficulty concentrating, remembering, or making decisions?: No Does the patient have difficulty walking or climbing stairs?: No Does the patient have difficulty dressing or bathing?: No Does the  patient have difficulty doing errands alone such as visiting a doctor's office or shopping?: No    Assessment & Plan  1. Subclinical hypothyroidism  - TSH  2. Allergic contact dermatitis  - triamcinolone cream (KENALOG) 0.1 %; Apply 1 application topically 2 (two) times daily.  Dispense: 80 g; Refill: 2 - ranitidine (ZANTAC) 150 MG tablet; Take 1 tablet (150 mg total) by mouth 2 (two) times daily.  Dispense: 60 tablet; Refill: 3 - montelukast (SINGULAIR) 10 MG tablet; Take 1 tablet (10 mg total) by mouth at bedtime.  Dispense: 30 tablet; Refill: 3 - levocetirizine (XYZAL) 5 MG tablet; Take 1 tablet (5 mg total) by mouth daily.  Dispense: 30 tablet; Refill: 3 - hydrOXYzine (ATARAX/VISTARIL) 25 MG tablet; Take 1 tablet (25 mg total) by mouth 2 (two) times daily.  Dispense: 30 tablet; Refill: 3 - Clobetasol Propionate (TEMOVATE) 0.05 % external spray; Apply topically 2 (two) times daily.  Dispense: 59 mL; Refill: 2 - pimecrolimus (ELIDEL) 1 % cream; Apply topically 2 (two) times daily.  Dispense: 100 g; Refill: 3 - predniSONE (STERAPRED UNI-PAK 48 TAB) 5 MG (48) TBPK tablet; Take 1 tablet (5 mg total) by mouth daily.  Dispense: 48 tablet; Refill: 0  3. Hives  - ranitidine (ZANTAC) 150 MG tablet; Take 1 tablet (150 mg total) by mouth 2 (two) times daily.  Dispense: 60 tablet; Refill: 3 - montelukast (SINGULAIR) 10 MG tablet; Take 1  tablet (10 mg total) by mouth at bedtime.  Dispense: 30 tablet; Refill: 3 - levocetirizine (XYZAL) 5 MG tablet; Take 1 tablet (5 mg total) by mouth daily.  Dispense: 30 tablet; Refill: 3 - hydrOXYzine (ATARAX/VISTARIL) 25 MG tablet; Take 1 tablet (25 mg total) by mouth 2 (two) times daily.  Dispense: 30 tablet; Refill: 3 - predniSONE (STERAPRED UNI-PAK 48 TAB) 5 MG (48) TBPK tablet; Take 1 tablet (5 mg total) by mouth daily.  Dispense: 48 tablet; Refill: 0  4. Vegetarian  - VITAMIN D 25 Hydroxy (Vit-D Deficiency, Fractures) - Vitamin B12 - Folate   5. Other specified nutritional anemias  - ferrous sulfate 325 (65 FE) MG tablet; Take 1 tablet (325 mg total) by mouth daily with breakfast.  Dispense: 60 tablet; Refill: 2 - Hematocrit - Ferritin

## 2015-08-03 NOTE — Addendum Note (Signed)
Addended by: Cynda FamiliaJOHNSON, Leiland Mihelich L on: 08/03/2015 05:20 PM   Modules accepted: Orders

## 2015-08-04 NOTE — Telephone Encounter (Signed)
Left message to inform patient to return call to schedule appointment.

## 2015-10-20 ENCOUNTER — Encounter: Payer: Self-pay | Admitting: Family Medicine

## 2015-10-20 ENCOUNTER — Ambulatory Visit (INDEPENDENT_AMBULATORY_CARE_PROVIDER_SITE_OTHER): Payer: Managed Care, Other (non HMO) | Admitting: Family Medicine

## 2015-10-20 VITALS — BP 122/70 | HR 112 | Temp 99.7°F | Resp 16 | Wt 206.1 lb

## 2015-10-20 DIAGNOSIS — M25572 Pain in left ankle and joints of left foot: Secondary | ICD-10-CM

## 2015-10-20 MED ORDER — HYDROCODONE-ACETAMINOPHEN 10-325 MG PO TABS: 1.0000 | ORAL_TABLET | Freq: Four times a day (QID) | ORAL | Status: DC | PRN

## 2015-10-20 NOTE — Progress Notes (Signed)
Name: Cynthia Villa   MRN: [id number]    DOB: [birth year]   Date:10/20/2015       Progress Note  Subjective  Chief Complaint  Chief Complaint  Patient presents with  . Ankle Pain    onset 6 weeks, worsens with walking and standing,  patient stated it is keeping her up at night    HPI  Ankle pain: she twisted her left ankle while going down steps and the left foot turned in about 6 weeks ago. She states pain was intense initially, felt a little better but is getting progressively worse again. Improves with rest and by the end of the day she is in severe pain, mild swelling, no redness, pain is keeping her up at night. She has high pain tolerance. She fracture her wrist and took her two weeks to see a doctor at the time.     Patient Active Problem List   Diagnosis Date Noted  . 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 History of thyroid nodule 03/04/2015  . Allergic contact dermatitis 03/03/2015  . Allergic state 03/03/2015  . Allergic to food 03/03/2015  . History of hyperthyroidism 06/17/2011  . Subclinical hypothyroidism 06/17/2011    Past Surgical History  Procedure Laterality Date  . Wrist fracture surgery Left 09/08/09    Repaired- August 2004    Family History  Problem Relation Age of Onset  . Diabetes Mother     DM-2nd to chronic pancreatitis  . Alcohol abuse Mother   . Cancer Mother     Breast  . Cholelithiasis Mother   . Sleep apnea Father   . Hypertension Father   . PKU Son   . Polycystic ovary syndrome Sister   . Cholelithiasis Sister     Social History   Social History  . Marital Status: Married    Spouse Name: N/A  . Number of Children: N/A  . Years of Education: N/A   Occupational History  . Not on file.   Social History Main Topics  . Smoking status: Never Smoker   . Smokeless tobacco: Never Used  . Alcohol Use: No  . Drug Use: No  . Sexual Activity:    Partners: Male   Other Topics Concern  . Not on file   Social History Narrative      Current outpatient prescriptions:  .  albuterol (ACCUNEB) 0.63 MG/3ML nebulizer solution, Inhale into the lungs as needed., Disp: , Rfl:  .  Clobetasol Propionate (TEMOVATE) 0.05 % external spray, Apply topically 2 (two) times daily., Disp: 59 mL, Rfl: 2 .  ferrous sulfate 325 (65 FE) MG tablet, Take 1 tablet (325 mg total) by mouth daily with breakfast., Disp: 60 tablet, Rfl: 2 .  fluticasone (FLONASE) 50 MCG/ACT nasal spray, Place 2 sprays into the nose as needed., Disp: , Rfl:  .  HYDROcodone-acetaminophen (NORCO) 10-325 MG tablet, Take 1 tablet by mouth every 6 (six) hours as needed., Disp: 20 tablet, Rfl: 0 .  hydrOXYzine (ATARAX/VISTARIL) 25 MG tablet, Take 1 tablet (25 mg total) by mouth 2 (two) times daily., Disp: 30 tablet, Rfl: 3 .  levocetirizine (XYZAL) 5 MG tablet, Take 1 tablet (5 mg total) by mouth daily., Disp: 30 tablet, Rfl: 3 .  montelukast (SINGULAIR) 10 MG tablet, Take 1 tablet (10 mg total) by mouth at bedtime., Disp: 30 tablet, Rfl: 3 .  PARAGARD INTRAUTERINE COPPER IUD IUD, by Intrauterine route., Disp: , Rfl:  .  pimecrolimus (ELIDEL) 1 % cream, Apply topically 2 (two) times  daily., Disp: 100 g, Rfl: 3 .  ranitidine (ZANTAC) 150 MG tablet, Take 1 tablet (150 mg total) by mouth 2 (two) times daily., Disp: 60 tablet, Rfl: 3 .  triamcinolone cream (KENALOG) 0.1 %, Apply 1 application topically 2 (two) times daily., Disp: 80 g, Rfl: 2  Allergies  Allergen Reactions  . Cetirizine Other (See Comments)    Other Reaction: CNS Disorder  . Monistat  [Miconazole] Nausea And Vomiting  . Orange Fruit [Citrus] Swelling  . Penicillins Hives  . Permethrin     rash  . Sudafed  [Pseudoephedrine]     Other reaction(s): Muscle Pain  . Banana Rash  . Pineapple Rash     ROS  Ten systems reviewed and is negative except as mentioned in HPI . Some sinus pressure and nasal congestion.  Objective  Filed Vitals:   10/20/15 1516  BP: 122/70  Pulse: 112  Temp: 99.7 F (37.6  C)  TempSrc: Oral  Resp: 16  Weight: 206 lb 1.6 oz (93.486 kg)  SpO2: 96%    Body mass index is 37.69 kg/(m^2).  Physical Exam  Constitutional: Patient appears well-developed and well-nourished. Obese  No distress.  HEENT: head atraumatic, normocephalic, pupils equal and reactive to light, neck supple, throat within normal limits Cardiovascular: Normal rate, regular rhythm and normal heart sounds.  No murmur heard. No BLE edema. Pulmonary/Chest: Effort normal and breath sounds normal. No respiratory distress. Abdominal: Soft.  There is no tenderness. Psychiatric: Patient has a normal mood and affect. behavior is normal. Judgment and thought content normal. Muscular Skeletal: pain during palpation of left lateral malleolus, no swelling or erythema   PH[facility]9: [facility] 10/20/2015 08/03/2015 05/04/2015 03/04/2015  Decreased Interest 0 0 0 0  Down, Depressed, Hopeless 0 0 0 0  PHQ - 2 Score 0 0 0 0     Fall Risk: Fall Risk  10/20/2015 08/03/2015 05/04/2015 03/04/2015  Falls in the past year? No No No Yes  Number falls in past yr: - - - 1  Injury with Fall? - - - No     Functional Status Survey: Is the patient deaf or have difficulty hearing?: Yes (patient states that right ear seemed clogged) Does the patient have difficulty seeing, even when wearing glasses/contacts?: No Does the patient have difficulty concentrating, remembering, or making decisions?: No Does the patient have difficulty walking or climbing stairs?: No Does the patient have difficulty dressing or bathing?: No Does the patient have difficulty doing errands alone such as visiting a doctor's office or shopping?: No    Assessment & Plan  1. Left ankle pain  - HYDROcodone-acetaminophen (NORCO) 10-325 MG tablet; Take 1 tablet by mouth every 6 (six) hours as needed.  Dispense: 20 tablet; Refill: 0 - Ambulatory referral to Podiatry

## 2016-03-01 ENCOUNTER — Ambulatory Visit (INDEPENDENT_AMBULATORY_CARE_PROVIDER_SITE_OTHER): Payer: Managed Care, Other (non HMO) | Admitting: Family Medicine

## 2016-03-01 ENCOUNTER — Encounter: Payer: Self-pay | Admitting: Family Medicine

## 2016-03-01 VITALS — BP 128/88 | HR 107 | Temp 98.7°F | Resp 18 | Ht 62.0 in | Wt 207.9 lb

## 2016-03-01 DIAGNOSIS — E039 Hypothyroidism, unspecified: Secondary | ICD-10-CM

## 2016-03-01 DIAGNOSIS — L309 Dermatitis, unspecified: Secondary | ICD-10-CM | POA: Diagnosis not present

## 2016-03-01 DIAGNOSIS — E038 Other specified hypothyroidism: Secondary | ICD-10-CM | POA: Diagnosis not present

## 2016-03-01 DIAGNOSIS — Z789 Other specified health status: Secondary | ICD-10-CM | POA: Diagnosis not present

## 2016-03-01 DIAGNOSIS — Z8639 Personal history of other endocrine, nutritional and metabolic disease: Secondary | ICD-10-CM | POA: Diagnosis not present

## 2016-03-01 DIAGNOSIS — Z131 Encounter for screening for diabetes mellitus: Secondary | ICD-10-CM

## 2016-03-01 DIAGNOSIS — Z79899 Other long term (current) drug therapy: Secondary | ICD-10-CM | POA: Diagnosis not present

## 2016-03-01 DIAGNOSIS — Z13220 Encounter for screening for lipoid disorders: Secondary | ICD-10-CM

## 2016-03-01 LAB — COMPLETE METABOLIC PANEL WITH GFR
ALBUMIN: 4.2 g/dL (ref 3.6–5.1)
ALK PHOS: 59 U/L (ref 33–115)
ALT: 19 U/L (ref 6–29)
AST: 16 U/L (ref 10–30)
BILIRUBIN TOTAL: 0.3 mg/dL (ref 0.2–1.2)
BUN: 12 mg/dL (ref 7–25)
CO2: 25 mmol/L (ref 20–31)
Calcium: 9 mg/dL (ref 8.6–10.2)
Chloride: 104 mmol/L (ref 98–110)
Creat: 1.12 mg/dL — ABNORMAL HIGH (ref 0.50–1.10)
GFR, EST AFRICAN AMERICAN: 74 mL/min (ref >=60)
GFR, EST NON AFRICAN AMERICAN: 64 mL/min (ref >=60)
Glucose, Bld: 99 mg/dL (ref 65–99)
Potassium: 4.2 mmol/L (ref 3.5–5.3)
Sodium: 141 mmol/L (ref 135–146)
TOTAL PROTEIN: 7.3 g/dL (ref 6.1–8.1)

## 2016-03-01 LAB — FERRITIN: Ferritin: 10 ng/mL (ref 10–154)

## 2016-03-01 LAB — LIPID PANEL
Cholesterol: 203 mg/dL — ABNORMAL HIGH (ref 125–200)
HDL: 61 mg/dL
LDL Cholesterol: 117 mg/dL
Total CHOL/HDL Ratio: 3.3 ratio
Triglycerides: 125 mg/dL
VLDL: 25 mg/dL

## 2016-03-01 LAB — TSH: TSH: 2.08 m[IU]/L

## 2016-03-01 LAB — VITAMIN B12: Vitamin B-12: 431 pg/mL (ref 200–1100)

## 2016-03-01 LAB — HEMOGLOBIN: HEMOGLOBIN: 13.6 g/dL (ref 11.7–15.5)

## 2016-03-01 LAB — HEMATOCRIT: HEMATOCRIT: 40.9 % (ref 35.0–45.0)

## 2016-03-01 NOTE — Progress Notes (Signed)
Name: Cynthia Villa   MRN: [id number]    DOB: [birth year]   Date:03/01/2016       Progress Note  Subjective  Chief Complaint  Chief Complaint  Patient presents with  . Rash    Onset- 3 Monday and went to [facility] on Saturday the 8th due to bilateral eyes were swollen shut and was given antibiotic Clindamyclin and had Prednisone at home. But states rash is still on her bilateral hands, face, neck-red, itchy, blisters and there was yellow pus coming from them. Patient felt like it was Eczema [place] and then it got infected.     HPI  Eczema flare and infection: she was outdoors about 10 days ago because her parents were in town. She [place] the [place], had to use more sunscreen, developed severe rash on face, neck  and both hands and upper arms that was severe, oozing, crusty, blisters, . She went to Urgent care and was given Clindamycin and the swelling went down but still had a rash, so she started taking a prednisone taper that she had a home ( four days ago ), she is doing great, rash on the face is almost completely resolved, and arms almost gone also, except for wrist, still has some pilling, but is dry on both hands. She will follow up with Dr. [place] Callas to be able to start allergy shots  Hypothyroidism: she had hyperthyroidism, and was treated orally but stopped during 2nd pregnancy about 8 years ago and did not have to go back on medication, she also had to take synthroid before last pregnancy, but only took for about one month. We will recheck labs  Vegetarian: she states last time she donated blood and was anemic, stopped taking ferrous sulfate, but is trying to eat meat about once a week to bring it up.   Patient Active Problem List   Diagnosis Date Noted  . 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 History of thyroid nodule 03/04/2015  . Allergic contact dermatitis 03/03/2015  . Allergic state 03/03/2015  . Allergic to food 03/03/2015   . History of hyperthyroidism 06/17/2011  . Subclinical hypothyroidism 06/17/2011    Past Surgical History  Procedure Laterality Date  . Wrist fracture surgery Left 09/08/09    Repaired- August 2004    Family History  Problem Relation Age of Onset  . Diabetes Mother     DM-2nd to chronic pancreatitis  . Alcohol abuse Mother   . Cancer Mother     Breast  . Cholelithiasis Mother   . Sleep apnea Father   . Hypertension Father   . PKU Son   . Polycystic ovary syndrome Sister   . Cholelithiasis Sister     Social History   Social History  . Marital Status: Married    Spouse Name: N/A  . Number of Children: N/A  . Years of Education: N/A   Occupational History  . Not on file.   Social History Main Topics  . Smoking status: Never Smoker   . Smokeless tobacco: Never Used  . Alcohol Use: No  . Drug Use: No  . Sexual Activity:    Partners: Male   Other Topics Concern  . Not on file   Social History Narrative     Current outpatient prescriptions:  .  Clobetasol Propionate (TEMOVATE) 0.05 % external spray, Apply topically 2 (two) times daily., Disp: 59 mL, Rfl: 2 .  hydrOXYzine (ATARAX/VISTARIL)  25 MG tablet, Take 1 tablet (25 mg total) by mouth 2 (two) times daily., Disp: 30 tablet, Rfl: 3 .  levocetirizine (XYZAL) 5 MG tablet, Take 1 tablet (5 mg total) by mouth daily., Disp: 30 tablet, Rfl: 3 .  montelukast (SINGULAIR) 10 MG tablet, Take 1 tablet (10 mg total) by mouth at bedtime., Disp: 30 tablet, Rfl: 3 .  PARAGARD INTRAUTERINE COPPER IUD IUD, by Intrauterine route., Disp: , Rfl:  .  pimecrolimus (ELIDEL) 1 % cream, Apply topically 2 (two) times daily., Disp: 100 g, Rfl: 3 .  ranitidine (ZANTAC) 150 MG tablet, Take 1 tablet (150 mg total) by mouth 2 (two) times daily., Disp: 60 tablet, Rfl: 3 .  triamcinolone cream (KENALOG) 0.1 %, Apply 1 application topically 2 (two) times daily., Disp: 80 g, Rfl: 2 .  fluticasone (FLONASE) 50 MCG/ACT nasal spray, Place 2 sprays  into the nose as needed. Reported on 03/01/2016, Disp: , Rfl:   Allergies  Allergen Reactions  . Cetirizine Other (See Comments)    Other Reaction: CNS Disorder  . Monistat  [Miconazole] Nausea And Vomiting  . Orange Fruit [Citrus] Swelling  . Penicillins Hives  . Permethrin     rash  . Sudafed  [Pseudoephedrine]     Other reaction(s): Muscle Pain  . Banana Rash  . Pineapple Rash     ROS  Ten systems reviewed and is negative except as mentioned in HPI   Objective  Filed Vitals:   03/01/16 0951  BP: 128/88  Pulse: 107  Temp: 98.7 F (37.1 C)  TempSrc: Oral  Resp: 18  Height:  (1.575 m)  Weight: 207 lb 14.4 oz (94.303 kg)  SpO2: 97%    Body mass index is 38.02 kg/(m^2).  Physical Exam  Constitutional: Patient appears well-developed and well-nourished. Obese No distress.  HEENT: head atraumatic, normocephalic, pupils equal and reactive to light,neck supple, throat within normal limits Cardiovascular: Normal rate, regular rhythm and normal heart sounds.  No murmur heard. No BLE edema. Pulmonary/Chest: Effort normal and breath sounds normal. No respiratory distress. Abdominal: Soft.  There is no tenderness. Psychiatric: Patient has a normal mood and affect. behavior is normal. Judgment and thought content normal. Skin: eczematous rash on both hands and arms, no oozing right now  PHQ2/9: Depression screen [facility] 2/9 03/01/2016 10/20/2015 08/03/2015 05/04/2015 03/04/2015  Decreased Interest 0 0 0 0 0  Down, Depressed, Hopeless 0 0 0 0 0  PHQ - 2 Score 0 0 0 0 0    Fall Risk: Fall Risk  03/01/2016 10/20/2015 08/03/2015 05/04/2015 03/04/2015  Falls in the past year? No No No No Yes  Number falls in past yr: - - - - 1  Injury with Fall? - - - - No    Functional Status Survey: Is the patient deaf or have difficulty hearing?: No Does the patient have difficulty seeing, even when wearing glasses/contacts?: No Does the patient have difficulty concentrating, remembering, or  making decisions?: No Does the patient have difficulty walking or climbing stairs?: No Does the patient have difficulty dressing or bathing?: No Does the patient have difficulty doing errands alone such as visiting a doctor's office or shopping?: No    Assessment & Plan  1. Eczema  Advised to finish allergy shots, try to check ingredients of sunscreen  2. Subclinical hypothyroidism  - TSH  3. Vegetarian  - VITAMIN D 25 Hydroxy (Vit-D Deficiency, Fractures) - Vitamin B12  4. Lipid screening  - Lipid panel  5. History of  iron deficiency  - Ferritin - Hematocrit - Hemoglobin  6. Diabetes mellitus screening  - Hemoglobin A1c  7. Long-term use of high-risk medication  - COMPLETE METABOLIC PANEL WITH GFR

## 2016-03-02 LAB — HEMOGLOBIN A1C
HEMOGLOBIN A1C: 6.2 % — AB (ref <5.7)
MEAN PLASMA GLUCOSE: 131 mg/dL

## 2016-03-02 LAB — VITAMIN D 25 HYDROXY (VIT D DEFICIENCY, FRACTURES): Vit D, 25-Hydroxy: 26 ng/mL — ABNORMAL LOW (ref 30–100)

## 2016-03-03 ENCOUNTER — Telehealth: Payer: Self-pay

## 2016-03-03 NOTE — Telephone Encounter (Signed)
-----   Message from Alba CoryKrichna Sowles, MD sent at 03/02/2016  3:43 PM EDT ----- Vitamin D is slightly low and needs to take vitamin D 2000 units daily  hgbA1C shows pre-diabetes, she needs to follow a low carbohydrate diet Normal TSH and B12 Sugar, kidney and transaminases are within normal limits She is not anemic but iron storage is low and she will benefit from continues iron rich diet and occasional supplementation with otc ferrous sulfate

## 2016-03-03 NOTE — Telephone Encounter (Signed)
Patient unavailable left vm for patient to return my call for labs.

## 2016-04-03 ENCOUNTER — Other Ambulatory Visit: Payer: Self-pay | Admitting: Family Medicine

## 2016-04-03 DIAGNOSIS — L239 Allergic contact dermatitis, unspecified cause: Secondary | ICD-10-CM

## 2016-04-03 DIAGNOSIS — L509 Urticaria, unspecified: Secondary | ICD-10-CM

## 2016-04-11 ENCOUNTER — Encounter: Payer: Self-pay | Admitting: Family Medicine

## 2016-04-11 ENCOUNTER — Other Ambulatory Visit: Payer: Self-pay | Admitting: Family Medicine

## 2016-04-11 ENCOUNTER — Ambulatory Visit (INDEPENDENT_AMBULATORY_CARE_PROVIDER_SITE_OTHER): Payer: Managed Care, Other (non HMO) | Admitting: Family Medicine

## 2016-04-11 VITALS — BP 110/60 | HR 139 | Temp 99.4°F | Resp 16 | Ht 62.0 in | Wt 210.6 lb

## 2016-04-11 DIAGNOSIS — R000 Tachycardia, unspecified: Secondary | ICD-10-CM | POA: Diagnosis not present

## 2016-04-11 DIAGNOSIS — R05 Cough: Secondary | ICD-10-CM | POA: Diagnosis not present

## 2016-04-11 DIAGNOSIS — M545 Low back pain, unspecified: Secondary | ICD-10-CM

## 2016-04-11 DIAGNOSIS — R058 Other specified cough: Secondary | ICD-10-CM

## 2016-04-11 DIAGNOSIS — R509 Fever, unspecified: Secondary | ICD-10-CM | POA: Diagnosis not present

## 2016-04-11 LAB — CBC WITH DIFFERENTIAL/PLATELET
BASOS PCT: 0 %
Basophils Absolute: 0 cells/uL (ref 0–200)
EOS ABS: 0 {cells}/uL — AB (ref 15–500)
EOS PCT: 0 %
HCT: 39.1 % (ref 35.0–45.0)
Hemoglobin: 12.9 g/dL (ref 11.7–15.5)
Lymphocytes Relative: 22 %
Lymphs Abs: 1298 cells/uL (ref 850–3900)
MCH: 28.1 pg (ref 27.0–33.0)
MCHC: 33 g/dL (ref 32.0–36.0)
MCV: 85.2 fL (ref 80.0–100.0)
MONOS PCT: 6 %
MPV: 11 fL (ref 7.5–12.5)
Monocytes Absolute: 354 cells/uL (ref 200–950)
NEUTROS ABS: 4248 {cells}/uL (ref 1500–7800)
Neutrophils Relative %: 72 %
PLATELETS: 200 10*3/uL (ref 140–400)
RBC: 4.59 MIL/uL (ref 3.80–5.10)
RDW: 15.9 % — ABNORMAL HIGH (ref 11.0–15.0)
WBC: 5.9 10*3/uL (ref 3.8–10.8)

## 2016-04-11 LAB — POCT URINALYSIS DIPSTICK
Bilirubin, UA: NEGATIVE
Glucose, UA: NEGATIVE
Ketones, UA: NEGATIVE
Leukocytes, UA: NEGATIVE
Nitrite, UA: NEGATIVE
PROTEIN UA: NEGATIVE
RBC UA: NEGATIVE
SPEC GRAV UA: 1.02
UROBILINOGEN UA: NEGATIVE
pH, UA: 7

## 2016-04-11 LAB — COMPLETE METABOLIC PANEL WITH GFR
ALT: 27 U/L (ref 6–29)
AST: 25 U/L (ref 10–30)
Albumin: 3.9 g/dL (ref 3.6–5.1)
Alkaline Phosphatase: 50 U/L (ref 33–115)
BUN: 8 mg/dL (ref 7–25)
CHLORIDE: 101 mmol/L (ref 98–110)
CO2: 26 mmol/L (ref 20–31)
Calcium: 8.4 mg/dL — ABNORMAL LOW (ref 8.6–10.2)
Creat: 1.05 mg/dL (ref 0.50–1.10)
GFR, EST AFRICAN AMERICAN: 80 mL/min (ref >=60)
GFR, EST NON AFRICAN AMERICAN: 69 mL/min (ref >=60)
Glucose, Bld: 102 mg/dL — ABNORMAL HIGH (ref 65–99)
POTASSIUM: 3.7 mmol/L (ref 3.5–5.3)
Sodium: 137 mmol/L (ref 135–146)
Total Bilirubin: 0.7 mg/dL (ref 0.2–1.2)
Total Protein: 6.8 g/dL (ref 6.1–8.1)

## 2016-04-11 LAB — C-REACTIVE PROTEIN: CRP: 13.7 mg/dL — AB (ref <0.60)

## 2016-04-11 LAB — TSH: TSH: 1.88 m[IU]/L

## 2016-04-11 MED ORDER — CIPROFLOXACIN HCL 250 MG PO TABS: 250.0000 mg | ORAL_TABLET | Freq: Two times a day (BID) | ORAL | 0 refills | Status: DC

## 2016-04-11 NOTE — Progress Notes (Signed)
Name: Cynthia Villa   MRN: [id number]    DOB: 11/18/1980   Date:04/11/2016       Progress Note  Subjective  Chief Complaint  Chief Complaint  Patient presents with  . Fever    lower abdominal pain and lower back pain    HPI  Fever: she states she has been feeling sick for the past 5 days. Initially she was just feeling very tired , so she checked her temperature and it was 103. She has also noticed some low back pain that radiates to lower quadrants, but initially she associated with her new exercise routine at home. She also has a mild dry cough (started last week ), no nausea, vomiting or change in appetite. She has been feeling tired, but no joint aches, mild headache last week but not now. She feels hot and flushed   Patient Active Problem List   Diagnosis Date Noted  . 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 History of thyroid nodule 03/04/2015  . Allergic contact dermatitis 03/03/2015  . Allergic state 03/03/2015  . Allergic to food 03/03/2015  . History of hyperthyroidism 06/17/2011  . Subclinical hypothyroidism 06/17/2011    Past Surgical History:  Procedure Laterality Date  . WRIST FRACTURE SURGERY Left 09/08/09   Repaired- August 2004    Family History  Problem Relation Age of Onset  . Diabetes Mother     DM-2nd to chronic pancreatitis  . Alcohol abuse Mother   . Cancer Mother     Breast  . Cholelithiasis Mother   . Sleep apnea Father   . Hypertension Father   . PKU Son   . Polycystic ovary syndrome Sister   . Cholelithiasis Sister     Social History   Social History  . Marital status: Married    Spouse name: N/A  . Number of children: N/A  . Years of education: N/A   Occupational History  . Not on file.   Social History Main Topics  . Smoking status: Never Smoker  . Smokeless tobacco: Never Used  . Alcohol use No  . Drug use: No  . Sexual activity: Yes    Partners: Male   Other Topics Concern  . Not on file   Social History Narrative  . No  narrative on file     Current Outpatient Prescriptions:  .  Clobetasol Propionate (TEMOVATE) 0.05 % external spray, Apply topically 2 (two) times daily., Disp: 59 mL, Rfl: 2 .  fluticasone (FLONASE) 50 MCG/ACT nasal spray, Place 2 sprays into the nose as needed. Reported on 03/01/2016, Disp: , Rfl:  .  hydrOXYzine (ATARAX/VISTARIL) 25 MG tablet, Take 1 tablet (25 mg total) by mouth 2 (two) times daily., Disp: 30 tablet, Rfl: 3 .  levocetirizine (XYZAL) 5 MG tablet, Take 1 tablet (5 mg total) by mouth daily., Disp: 30 tablet, Rfl: 3 .  montelukast (SINGULAIR) 10 MG tablet, TAKE 1 TABLET(10 MG) BY MOUTH AT BEDTIME, Disp: 30 tablet, Rfl: 0 .  PARAGARD INTRAUTERINE COPPER IUD IUD, by Intrauterine route., Disp: , Rfl:  .  pimecrolimus (ELIDEL) 1 % cream, Apply topically 2 (two) times daily., Disp: 100 g, Rfl: 3 .  ranitidine (ZANTAC) 150 MG tablet, Take 1 tablet (150 mg total) by mouth 2 (two) times daily., Disp: 60 tablet, Rfl: 3 .  triamcinolone cream (KENALOG) 0.1 %, Apply 1 application topically 2 (two) times daily., Disp: 80 g, Rfl: 2  Allergies  Allergen Reactions  . Cetirizine Other (See Comments)    Other  Reaction: CNS Disorder  . Monistat  [Miconazole] Nausea And Vomiting  . Orange Fruit [Citrus] Swelling  . Penicillins Hives  . Permethrin     rash  . Sudafed  [Pseudoephedrine]     Other reaction(s): Muscle Pain  . Banana Rash  . Pineapple Rash     ROS  Ten systems reviewed and is negative except as mentioned in HPI  Objective  Vitals:   04/11/16 1527  BP: 110/60  Pulse: (!) 139  Resp: 16  Temp: 99.4 F (37.4 C)  TempSrc: Oral  SpO2: 98%  Weight: 210 lb 9.6 oz (95.5 kg)  Height: 5\' 2"  (1.575 m)    Body mass index is 38.52 kg/m.  Physical Exam  Constitutional: Patient appears well-developed and pale.  Obese No distress.  HEENT: head atraumatic, normocephalic, pupils equal and reactive to light, neck supple, throat within normal limits Cardiovascular: Normal  rate, regular rhythm and normal heart sounds.  No murmur heard. No BLE edema. Pulmonary/Chest: Effort normal and breath sounds normal. No respiratory distress. Abdominal: Soft.  There is no tenderness. Psychiatric: Patient has a normal mood and affect. behavior is normal. Judgment and thought content normal.  Recent Results (from the past 2160 hour(s))  COMPLETE METABOLIC PANEL WITH GFR     Status: Abnormal   Collection Time: 03/01/16 10:52 AM  Result Value Ref Range   Sodium 141 135 - 146 mmol/L   Potassium 4.2 3.5 - 5.3 mmol/L   Chloride 104 98 - 110 mmol/L   CO2 25 20 - 31 mmol/L   Glucose, Bld 99 65 - 99 mg/dL   BUN 12 7 - 25 mg/dL   Creat 1.61 (H) 0.96 - 1.10 mg/dL   Total Bilirubin 0.3 0.2 - 1.2 mg/dL   Alkaline Phosphatase 59 33 - 115 U/L   AST 16 10 - 30 U/L   ALT 19 6 - 29 U/L   Total Protein 7.3 6.1 - 8.1 g/dL   Albumin 4.2 3.6 - 5.1 g/dL   Calcium 9.0 8.6 - 04.5 mg/dL   GFR, Est African American 74 >=60 mL/min   GFR, Est Non African American 64 >=60 mL/min  VITAMIN D 25 Hydroxy (Vit-D Deficiency, Fractures)     Status: Abnormal   Collection Time: 03/01/16 10:52 AM  Result Value Ref Range   Vit D, 25-Hydroxy 26 (L) 30 - 100 ng/mL    Comment: Vitamin D Status           25-OH Vitamin D        Deficiency                <20 ng/mL        Insufficiency         20 - 29 ng/mL        Optimal             > or = 30 ng/mL   For 25-OH Vitamin D testing on patients on D2-supplementation and patients for whom quantitation of D2 and D3 fractions is required, the [facility] 25-OH VIT D, (D2,D3), LC/MS/MS is recommended: order code 40981 (patients > 2 yrs).   Vitamin B12     Status: None   Collection Time: 03/01/16 10:52 AM  Result Value Ref Range   Vitamin B-12 431 200 - 1,100 pg/mL  TSH     Status: None   Collection Time: 03/01/16 10:52 AM  Result Value Ref Range   TSH 2.08 mIU/L    Comment:   Reference Range   >  or = 20 Years  0.40-4.50   Pregnancy Range First  trimester  0.26-2.66 Second trimester 0.55-2.73 Third trimester  0.43-2.91     Lipid panel     Status: Abnormal   Collection Time: 03/01/16 10:52 AM  Result Value Ref Range   Cholesterol 203 (H) 125 - 200 mg/dL   Triglycerides 409125 <811<150 mg/dL   HDL 61 >=91>=46 mg/dL   Total CHOL/HDL Ratio 3.3 <=5.0 Ratio   VLDL 25 <30 mg/dL   LDL Cholesterol 478117 <295<130 mg/dL    Comment:   Total Cholesterol/HDL Ratio:CHD Risk                        Coronary Heart Disease Risk Table                                        Men       Women          1/2 Average Risk              3.4        3.3              Average Risk              5.0        4.4           2X Average Risk              9.6        7.1           3X Average Risk             23.4       11.0 Use the calculated Patient Ratio above and the CHD Risk table  to determine the patient's CHD Risk.   Hemoglobin A1c     Status: Abnormal   Collection Time: 03/01/16 10:52 AM  Result Value Ref Range   Hgb A1c MFr Bld 6.2 (H) <5.7 %    Comment:   For someone without known diabetes, a hemoglobin A1c value between 5.7% and 6.4% is consistent with prediabetes and should be confirmed with a follow-up test.   For someone with known diabetes, a value <7% indicates that their diabetes is well controlled. A1c targets should be individualized based on duration of diabetes, age, co-morbid conditions and other considerations.   This assay result is consistent with an increased risk of diabetes.   Currently, no consensus exists regarding use of hemoglobin A1c for diagnosis of diabetes in children.      Mean Plasma Glucose 131 mg/dL  Ferritin     Status: None   Collection Time: 03/01/16 10:52 AM  Result Value Ref Range   Ferritin 10 10 - 154 ng/mL  Hematocrit     Status: None   Collection Time: 03/01/16 10:52 AM  Result Value Ref Range   HCT 40.9 35.0 - 45.0 %    Comment: ** Please note change in unit of measure and reference range(s). **  Hemoglobin      Status: None   Collection Time: 03/01/16 10:52 AM  Result Value Ref Range   Hemoglobin 13.6 11.7 - 15.5 g/dL    Comment: ** Please note change in unit of measure and reference range(s). **      [facility] 04/11/2016 03/01/2016 10/20/2015 08/03/2015 05/04/2015  Decreased  Interest 0 0 0 0 0  Down, Depressed, Hopeless 0 0 0 0 0  PHQ - 2 Score 0 0 0 0 0     Fall Risk: Fall Risk  04/11/2016 03/01/2016 10/20/2015 08/03/2015 05/04/2015  Falls in the past year? No No No No No  Number falls in past yr: - - - - -  Injury with Fall? - - - - -    Assessment & Plan  1. Fever, unspecified fever cause  She looks pale, we will try empirical therapy for UTI, but if no improvement go to [facility], follow up this week  - POCT Urinalysis Dipstick - CULTURE, URINE COMPREHENSIVE - CBC with Differential/Platelet - COMPLETE METABOLIC PANEL WITH GFR - TSH -C-reactive protein  - ciprofloxacin (CIPRO) 250 MG tablet; Take 1 tablet (250 mg total) by mouth 2 (two) times daily.  Dispense: 10 tablet; Refill: 0  2. Bilateral low back pain without sciatica  May be from UTI versus increase in exercise  3. Dry cough  Mild, normal lung exam, we   4. Tachycardia  - TSH

## 2016-04-14 ENCOUNTER — Encounter: Payer: Self-pay | Admitting: Family Medicine

## 2016-04-14 ENCOUNTER — Ambulatory Visit (INDEPENDENT_AMBULATORY_CARE_PROVIDER_SITE_OTHER): Payer: Managed Care, Other (non HMO) | Admitting: Family Medicine

## 2016-04-14 VITALS — BP 122/86 | HR 120 | Temp 98.2°F | Resp 18 | Ht 62.0 in | Wt 211.0 lb

## 2016-04-14 DIAGNOSIS — N3000 Acute cystitis without hematuria: Secondary | ICD-10-CM

## 2016-04-14 LAB — CULTURE, URINE COMPREHENSIVE

## 2016-04-14 NOTE — Progress Notes (Signed)
Name: Cynthia Villa   MRN: [id number]    DOB: February 28, 1981   Date:04/14/2016       Progress Note  Subjective  Chief Complaint  Chief Complaint  Patient presents with  . Follow-up    1 week sick visit  . Fever    Patient was in last week and had blood work done and was given Cipro for 5 days. Patient states she thought it was suppose to be for 10 day and wanted to double check that. Patient states she is feeling much better from antibiotic.     HPI  UTI: likely the cause of her symptoms, urine culture positive and symptoms improved with antibiotics, she states low back pain is almost gone, no longer has a fever, appetite is normal, body aches resolved, but still has mild fatigue.    Patient Active Problem List   Diagnosis Date Noted  . 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 History of thyroid nodule 03/04/2015  . Allergic contact dermatitis 03/03/2015  . Allergic state 03/03/2015  . Allergic to food 03/03/2015  . History of hyperthyroidism 06/17/2011  . Subclinical hypothyroidism 06/17/2011    Past Surgical History:  Procedure Laterality Date  . WRIST FRACTURE SURGERY Left 09/08/09   Repaired- August 2004    Family History  Problem Relation Age of Onset  . Diabetes Mother     DM-2nd to chronic pancreatitis  . Alcohol abuse Mother   . Cancer Mother     Breast  . Cholelithiasis Mother   . Sleep apnea Father   . Hypertension Father   . PKU Son   . Polycystic ovary syndrome Sister   . Cholelithiasis Sister     Social History   Social History  . Marital status: Married    Spouse name: N/A  . Number of children: N/A  . Years of education: N/A   Occupational History  . Not on file.   Social History Main Topics  . Smoking status: Never Smoker  . Smokeless tobacco: Never Used  . Alcohol use No  . Drug use: No  . Sexual activity: Yes    Partners: Male   Other Topics Concern  . Not on file   Social History Narrative  . No narrative on file     Current  Outpatient Prescriptions:  .  Clobetasol Propionate (TEMOVATE) 0.05 % external spray, Apply topically 2 (two) times daily., Disp: 59 mL, Rfl: 2 .  fluticasone (FLONASE) 50 MCG/ACT nasal spray, Place 2 sprays into the nose as needed. Reported on 03/01/2016, Disp: , Rfl:  .  hydrOXYzine (ATARAX/VISTARIL) 25 MG tablet, Take 1 tablet (25 mg total) by mouth 2 (two) times daily., Disp: 30 tablet, Rfl: 3 .  levocetirizine (XYZAL) 5 MG tablet, Take 1 tablet (5 mg total) by mouth daily., Disp: 30 tablet, Rfl: 3 .  montelukast (SINGULAIR) 10 MG tablet, TAKE 1 TABLET(10 MG) BY MOUTH AT BEDTIME, Disp: 30 tablet, Rfl: 0 .  PARAGARD INTRAUTERINE COPPER IUD IUD, by Intrauterine route., Disp: , Rfl:  .  pimecrolimus (ELIDEL) 1 % cream, Apply topically 2 (two) times daily., Disp: 100 g, Rfl: 3 .  ranitidine (ZANTAC) 150 MG tablet, Take 1 tablet (150 mg total) by mouth 2 (two) times daily., Disp: 60 tablet, Rfl: 3 .  triamcinolone cream (KENALOG) 0.1 %, Apply 1 application topically 2 (two) times daily., Disp: 80 g, Rfl: 2  Allergies  Allergen Reactions  . Cetirizine Other (See Comments)    Other Reaction: CNS Disorder  .  Monistat  [Miconazole] Nausea And Vomiting  . Orange Fruit [Citrus] Swelling  . Penicillins Hives  . Permethrin     rash  . Sudafed  [Pseudoephedrine]     Other reaction(s): Muscle Pain  . Banana Rash  . Pineapple Rash     ROS  Ten systems reviewed and is negative except as mentioned in HPI   Objective  Vitals:   04/14/16 1122  BP: 122/86  Pulse: (!) 120  Resp: 18  Temp: 98.2 F (36.8 C)  TempSrc: Oral  SpO2: 96%  Weight: 211 lb (95.7 kg)  Height: '5\' 2"'  (1.575 m)    Body mass index is 38.59 kg/m.  Physical Exam  Constitutional: Patient appears well-developed and well-nourished. Obese No distress.  HEENT: head atraumatic, normocephalic, pupils equal and reactive to light, neck supple, throat within normal limits Cardiovascular: Normal rate, regular rhythm and normal  heart sounds.  No murmur heard. No BLE edema. Pulmonary/Chest: Effort normal and breath sounds normal. No respiratory distress. Abdominal: Soft.  There is no tenderness. Negative CVA tenderness Psychiatric: Patient has a normal mood and affect. behavior is normal. Judgment and thought content normal.  Recent Results (from the past 2160 hour(s))  COMPLETE METABOLIC PANEL WITH GFR     Status: Abnormal   Collection Time: 03/01/16 10:52 AM  Result Value Ref Range   Sodium 141 135 - 146 mmol/L   Potassium 4.2 3.5 - 5.3 mmol/L   Chloride 104 98 - 110 mmol/L   CO2 25 20 - 31 mmol/L   Glucose, Bld 99 65 - 99 mg/dL   BUN 12 7 - 25 mg/dL   Creat 1.12 (H) 0.50 - 1.10 mg/dL   Total Bilirubin 0.3 0.2 - 1.2 mg/dL   Alkaline Phosphatase 59 33 - 115 U/L   AST 16 10 - 30 U/L   ALT 19 6 - 29 U/L   Total Protein 7.3 6.1 - 8.1 g/dL   Albumin 4.2 3.6 - 5.1 g/dL   Calcium 9.0 8.6 - 10.2 mg/dL   GFR, Est African American 74 >=60 mL/min   GFR, Est Non African American 64 >=60 mL/min  VITAMIN D 25 Hydroxy (Vit-D Deficiency, Fractures)     Status: Abnormal   Collection Time: 03/01/16 10:52 AM  Result Value Ref Range   Vit D, 25-Hydroxy 26 (L) 30 - 100 ng/mL    Comment: Vitamin D Status           25-OH Vitamin D        Deficiency                <20 ng/mL        Insufficiency         20 - 29 ng/mL        Optimal             > or = 30 ng/mL   For 25-OH Vitamin D testing on patients on D2-supplementation and patients for whom quantitation of D2 and D3 fractions is required, the [facility] 25-OH VIT D, (D2,D3), LC/MS/MS is recommended: order code [phone] (patients > 2 yrs).   Vitamin B12     Status: None   Collection Time: 03/01/16 10:52 AM  Result Value Ref Range   Vitamin B-12 431 200 - 1,100 pg/mL  TSH     Status: None   Collection Time: 03/01/16 10:52 AM  Result Value Ref Range   TSH 2.08 mIU/L    Comment:   Reference Range   > or =  20 Years  0.40-4.50   Pregnancy Range First trimester   0.26-2.66 Second trimester 0.55-2.73 Third trimester  0.43-2.91     Lipid panel     Status: Abnormal   Collection Time: 03/01/16 10:52 AM  Result Value Ref Range   Cholesterol 203 (H) 125 - 200 mg/dL   Triglycerides 125 <150 mg/dL   HDL 61 >=46 mg/dL   Total CHOL/HDL Ratio 3.3 <=5.0 Ratio   VLDL 25 <30 mg/dL   LDL Cholesterol 117 <130 mg/dL    Comment:   Total Cholesterol/HDL Ratio:CHD Risk                        Coronary Heart Disease Risk Table                                        Men       Women          1/2 Average Risk              3.4        3.3              Average Risk              5.0        4.4           2X Average Risk              9.6        7.1           3X Average Risk             23.4       11.0 Use the calculated Patient Ratio above and the CHD Risk table  to determine the patient's CHD Risk.   Hemoglobin A1c     Status: Abnormal   Collection Time: 03/01/16 10:52 AM  Result Value Ref Range   Hgb A1c MFr Bld 6.2 (H) <5.7 %    Comment:   For someone without known diabetes, a hemoglobin A1c value between 5.7% and 6.4% is consistent with prediabetes and should be confirmed with a follow-up test.   For someone with known diabetes, a value <7% indicates that their diabetes is well controlled. A1c targets should be individualized based on duration of diabetes, age, co-morbid conditions and other considerations.   This assay result is consistent with an increased risk of diabetes.   Currently, no consensus exists regarding use of hemoglobin A1c for diagnosis of diabetes in children.      Mean Plasma Glucose 131 mg/dL  Ferritin     Status: None   Collection Time: 03/01/16 10:52 AM  Result Value Ref Range   Ferritin 10 10 - 154 ng/mL  Hematocrit     Status: None   Collection Time: 03/01/16 10:52 AM  Result Value Ref Range   HCT 40.9 35.0 - 45.0 %    Comment: ** Please note change in unit of measure and reference range(s). **  Hemoglobin     Status: None    Collection Time: 03/01/16 10:52 AM  Result Value Ref Range   Hemoglobin 13.6 11.7 - 15.5 g/dL    Comment: ** Please note change in unit of measure and reference range(s). **  POCT Urinalysis Dipstick     Status: None   Collection Time: 04/11/16  3:42 PM  Result Value Ref Range   Color, UA yellow    Clarity, UA cloudy    Glucose, UA neg    Bilirubin, UA neg    Ketones, UA neg    Spec Grav, UA 1.020    Blood, UA neg    pH, UA 7.0    Protein, UA neg    Urobilinogen, UA negative    Nitrite, UA neg    Leukocytes, UA Negative Negative  TSH     Status: None   Collection Time: 04/11/16  3:50 PM  Result Value Ref Range   TSH 1.88 mIU/L    Comment:   Reference Range   > or = 20 Years  0.40-4.50   Pregnancy Range First trimester  0.26-2.66 Second trimester 0.55-2.73 Third trimester  0.43-2.91     CULTURE, URINE COMPREHENSIVE     Status: None (Preliminary result)   Collection Time: 04/11/16  3:52 PM  Result Value Ref Range   Colony Count 50,000-100,000 CFU/mL    Preliminary Report STAPHYLOCOCCUS SPECIES     Comment: May represent colonizers from external and internal genitalia.No further testing(including susceptibility)will be performed.    Colony Count 50,000-100,000 CFU/mL    Organism ID, Bacteria DIPTHEROIDS (CORYNEBACTERIUM SPECIES)   COMPLETE METABOLIC PANEL WITH GFR     Status: Abnormal (Preliminary result)   Collection Time: 04/11/16  3:52 PM  Result Value Ref Range   Sodium 137 135 - 146 mmol/L   Potassium 3.7 3.5 - 5.3 mmol/L   Chloride 101 98 - 110 mmol/L   CO2 26 20 - 31 mmol/L   Glucose, Bld 102 (H) 65 - 99 mg/dL   BUN 8 7 - 25 mg/dL   Creat 1.05 0.50 - 1.10 mg/dL   Total Bilirubin 0.7 0.2 - 1.2 mg/dL   Alkaline Phosphatase 50 33 - 115 U/L   AST 25 10 - 30 U/L   ALT 27 6 - 29 U/L   Total Protein 6.8 6.1 - 8.1 g/dL   Albumin 3.9 3.6 - 5.1 g/dL   Calcium 8.4 (L) 8.6 - 10.2 mg/dL   GFR, Est African American 80 >=60 mL/min   GFR, Est Non African American  69 >=60 mL/min  CBC with Differential/Platelet     Status: Abnormal (Preliminary result)   Collection Time: 04/11/16  3:52 PM  Result Value Ref Range   WBC 5.9 3.8 - 10.8 K/uL   RBC 4.59 3.80 - 5.10 MIL/uL   Hemoglobin 12.9 11.7 - 15.5 g/dL   HCT 39.1 35.0 - 45.0 %   MCV 85.2 80.0 - 100.0 fL   MCH 28.1 27.0 - 33.0 pg   MCHC 33.0 32.0 - 36.0 g/dL   RDW 15.9 (H) 11.0 - 15.0 %   Platelets 200 140 - 400 K/uL   MPV 11.0 7.5 - 12.5 fL   Neutro Abs 4,248 1,500 - 7,800 cells/uL   Lymphs Abs 1,298 850 - 3,900 cells/uL   Monocytes Absolute 354 200 - 950 cells/uL   Eosinophils Absolute 0 (L) 15 - 500 cells/uL   Basophils Absolute 0 0 - 200 cells/uL   Neutrophils Relative % 72 %   Lymphocytes Relative 22 %   Monocytes Relative 6 %   Eosinophils Relative 0 %   Basophils Relative 0 %   Smear Review Criteria for review not met   C-reactive protein     Status: Abnormal (Preliminary result)   Collection Time: 04/11/16  3:52 PM  Result Value Ref Range   CRP 13.7 (H) <0.60 mg/dL  PHQ2/9: Depression screen [facility] 2/9 04/11/2016 03/01/2016 10/20/2015 08/03/2015 05/04/2015  Decreased Interest 0 0 0 0 0  Down, Depressed, Hopeless 0 0 0 0 0  PHQ - 2 Score 0 0 0 0 0     Fall Risk: Fall Risk  04/11/2016 03/01/2016 10/20/2015 08/03/2015 05/04/2015  Falls in the past year? No No No No No  Number falls in past yr: - - - - -  Injury with Fall? - - - - -     Assessment & Plan  1. Acute cystitis without hematuria  Doing well, discussed urine culture , no longer having a fever, only mild low back pain

## 2016-04-18 ENCOUNTER — Other Ambulatory Visit: Payer: Self-pay | Admitting: Family Medicine

## 2016-04-18 DIAGNOSIS — R7982 Elevated C-reactive protein (CRP): Secondary | ICD-10-CM

## 2016-04-19 ENCOUNTER — Other Ambulatory Visit: Payer: Self-pay

## 2016-04-19 DIAGNOSIS — R7982 Elevated C-reactive protein (CRP): Secondary | ICD-10-CM

## 2016-05-16 ENCOUNTER — Other Ambulatory Visit: Payer: Self-pay | Admitting: Family Medicine

## 2016-05-16 DIAGNOSIS — L239 Allergic contact dermatitis, unspecified cause: Secondary | ICD-10-CM

## 2016-05-16 DIAGNOSIS — L509 Urticaria, unspecified: Secondary | ICD-10-CM

## 2016-08-06 ENCOUNTER — Other Ambulatory Visit: Payer: Self-pay | Admitting: Family Medicine

## 2016-08-06 DIAGNOSIS — L239 Allergic contact dermatitis, unspecified cause: Secondary | ICD-10-CM

## 2016-08-06 DIAGNOSIS — L509 Urticaria, unspecified: Secondary | ICD-10-CM

## 2016-09-02 ENCOUNTER — Other Ambulatory Visit: Payer: Self-pay | Admitting: Family Medicine

## 2016-09-02 DIAGNOSIS — L509 Urticaria, unspecified: Secondary | ICD-10-CM

## 2016-09-02 DIAGNOSIS — L239 Allergic contact dermatitis, unspecified cause: Secondary | ICD-10-CM

## 2016-09-15 ENCOUNTER — Encounter: Payer: Self-pay | Admitting: Family Medicine

## 2016-09-15 DIAGNOSIS — L509 Urticaria, unspecified: Secondary | ICD-10-CM | POA: Insufficient documentation

## 2016-09-15 DIAGNOSIS — J3081 Allergic rhinitis due to animal (cat) (dog) hair and dander: Secondary | ICD-10-CM | POA: Insufficient documentation

## 2016-11-04 ENCOUNTER — Encounter: Payer: Self-pay | Admitting: Family Medicine

## 2016-11-04 ENCOUNTER — Ambulatory Visit (INDEPENDENT_AMBULATORY_CARE_PROVIDER_SITE_OTHER): Payer: Managed Care, Other (non HMO) | Admitting: Family Medicine

## 2016-11-04 VITALS — BP 122/78 | HR 110 | Temp 99.0°F | Resp 16 | Ht 62.0 in | Wt 216.2 lb

## 2016-11-04 DIAGNOSIS — Z20828 Contact with and (suspected) exposure to other viral communicable diseases: Secondary | ICD-10-CM

## 2016-11-04 DIAGNOSIS — R591 Generalized enlarged lymph nodes: Secondary | ICD-10-CM

## 2016-11-04 LAB — CBC WITH DIFFERENTIAL/PLATELET
Basophils Absolute: 81 {cells}/uL (ref 0–200)
Basophils Relative: 1 %
Eosinophils Absolute: 486 {cells}/uL (ref 15–500)
Eosinophils Relative: 6 %
HCT: 41.8 % (ref 35.0–45.0)
Hemoglobin: 13.7 g/dL (ref 11.7–15.5)
Lymphocytes Relative: 31 %
Lymphs Abs: 2511 {cells}/uL (ref 850–3900)
MCH: 28.8 pg (ref 27.0–33.0)
MCHC: 32.8 g/dL (ref 32.0–36.0)
MCV: 87.8 fL (ref 80.0–100.0)
MPV: 10.3 fL (ref 7.5–12.5)
Monocytes Absolute: 405 {cells}/uL (ref 200–950)
Monocytes Relative: 5 %
Neutro Abs: 4617 {cells}/uL (ref 1500–7800)
Neutrophils Relative %: 57 %
Platelets: 290 K/uL (ref 140–400)
RBC: 4.76 MIL/uL (ref 3.80–5.10)
RDW: 16.4 % — ABNORMAL HIGH (ref 11.0–15.0)
WBC: 8.1 K/uL (ref 3.8–10.8)

## 2016-11-04 MED ORDER — CEFDINIR 300 MG PO CAPS: 300.0000 mg | ORAL_CAPSULE | Freq: Two times a day (BID) | ORAL | 0 refills | Status: DC

## 2016-11-04 NOTE — Patient Instructions (Signed)
GLP-1 agonist Victoza Ozempic  Trulicity Bydureon

## 2016-11-04 NOTE — Progress Notes (Signed)
Name: Cynthia RotaCasey Nims   MRN: [id number]    DOB: 26-Oct-1980   Date:11/04/2016       Progress Note  Subjective  Chief Complaint  Chief Complaint  Patient presents with  . Adenopathy    husband had tested positive for mono    HPI  Exposure to  Mono: she states that 3 weeks ago she developed sore throat a few weeks, fatigue and fever, husband diagnosed with Mono a couple of weeks ago. She has a history of mono. She is still feeling tired, but over the past 3 days she is feeling worse, has a tender and swollen area on right lower jaw, and temperature has gone up to 99.9. She is also feeling more tired, appetite is poor.   Patient Active Problem List   Diagnosis Date Noted  . 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 Urticaria 09/15/2016  . 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 History of thyroid nodule 03/04/2015  . Allergic contact dermatitis 03/03/2015  . Allergic state 03/03/2015  . Allergic to food 03/03/2015  . History of hyperthyroidism 06/17/2011  . Subclinical hypothyroidism 06/17/2011    Past Surgical History:  Procedure Laterality Date  . WRIST FRACTURE SURGERY Left 09/08/09   Repaired- August 2004    Family History  Problem Relation Age of Onset  . Diabetes Mother     DM-2nd to chronic pancreatitis  . Alcohol abuse Mother   . Cancer Mother     Breast  . Cholelithiasis Mother   . Sleep apnea Father   . Hypertension Father   . PKU Son   . Polycystic ovary syndrome Sister   . Cholelithiasis Sister     Social History   Social History  . Marital status: Married    Spouse name: N/A  . Number of children: N/A  . Years of education: N/A   Occupational History  . Not on file.   Social History Main Topics  . Smoking status: Never Smoker  . Smokeless tobacco: Never Used  . Alcohol use No  . Drug use: No  . Sexual activity: Yes    Partners: Male   Other Topics Concern  . Not on file   Social History Narrative  . No narrative on file     Current Outpatient  Prescriptions:  .  cefdinir (OMNICEF) 300 MG capsule, Take 1 capsule (300 mg total) by mouth 2 (two) times daily., Disp: 20 capsule, Rfl: 0 .  Clobetasol Propionate (TEMOVATE) 0.05 % external spray, Apply topically 2 (two) times daily., Disp: 59 mL, Rfl: 2 .  fluticasone (FLONASE) 50 MCG/ACT nasal spray, Place 2 sprays into the nose as needed. Reported on 03/01/2016, Disp: , Rfl:  .  hydrOXYzine (ATARAX/VISTARIL) 25 MG tablet, Take 1 tablet (25 mg total) by mouth 2 (two) times daily., Disp: 30 tablet, Rfl: 3 .  levocetirizine (XYZAL) 5 MG tablet, Take 1 tablet (5 mg total) by mouth daily., Disp: 30 tablet, Rfl: 3 .  montelukast (SINGULAIR) 10 MG tablet, TAKE 1 TABLET(10 MG) BY MOUTH AT BEDTIME, Disp: 30 tablet, Rfl: 2 .  PARAGARD INTRAUTERINE COPPER IUD IUD, by Intrauterine route., Disp: , Rfl:  .  pimecrolimus (ELIDEL) 1 % cream, Apply topically 2 (two) times daily., Disp: 100 g, Rfl: 3 .  ranitidine (ZANTAC) 150 MG tablet, TAKE 1 TABLET(150 MG) BY MOUTH TWICE DAILY, Disp: 60 tablet, Rfl: 0 .  triamcinolone cream (KENALOG) 0.1 %, Apply 1 application topically 2 (two) times daily., Disp: 80 g, Rfl: 2  Allergies  Allergen Reactions  . Cetirizine Other (See Comments)    Other Reaction: CNS Disorder  . Dog Epithelium   . Monistat  [Miconazole] Nausea And Vomiting  . Orange Fruit [Citrus] Swelling  . Penicillins Hives  . Permethrin     rash  . Sudafed  [Pseudoephedrine]     Other reaction(s): Muscle Pain  . Banana Rash  . Pineapple Rash     ROS  Ten systems reviewed and is negative except as mentioned in HPI   Objective  Vitals:   11/04/16 1051  BP: 122/78  Pulse: (!) 110  Resp: 16  Temp: 99 F (37.2 C)  SpO2: 97%  Weight: 216 lb 3 oz (98.1 kg)  Height: 5\' 2"  (1.575 m)    Body mass index is 39.54 kg/m.  Physical Exam  Constitutional: Patient appears well-developed and well-nourished. Obese  No distress.  HEENT: head atraumatic, normocephalic, pupils equal and reactive  to light, ears normal TM bilaterally, neck supple, throat within normal limits, she has anterior cervical adenopathy on both sides, but a tender, swollen area on jaw line, near chin, rolls under skin, seems to be lymphadenopathy, normal teeth Cardiovascular: Normal rate, regular rhythm and normal heart sounds.  No murmur heard. No BLE edema. Pulmonary/Chest: Effort normal and breath sounds normal. No respiratory distress. Abdominal: Soft.  There is no tenderness. Psychiatric: Patient has a normal mood and affect. behavior is normal. Judgment and thought content normal.  PHQ2/9: Depression screen [facility] 2/9 04/11/2016 03/01/2016 10/20/2015 08/03/2015 05/04/2015  Decreased Interest 0 0 0 0 0  Down, Depressed, Hopeless 0 0 0 0 0  PHQ - 2 Score 0 0 0 0 0     Fall Risk: Fall Risk  04/11/2016 03/01/2016 10/20/2015 08/03/2015 05/04/2015  Falls in the past year? No No No No No  Number falls in past yr: - - - - -  Injury with Fall? - - - - -     Assessment & Plan  1. Lymphadenopathy  Tender and on lower jaw on right side, unlikely to be from mono.  - cefdinir (OMNICEF) 300 MG capsule; Take 1 capsule (300 mg total) by mouth 2 (two) times daily.  Dispense: 20 capsule; Refill: 0 - CBC with Differential/Platelet  She is allergic to [facility], but states she has taken Omicef in the past without problems.   2. Exposure to mononucleosis syndrome  - EBV ab to viral capsid ag pnl, IgG+IgM

## 2016-11-04 NOTE — Addendum Note (Signed)
Addended by: Cynda FamiliaJOHNSON, Jilliane Kazanjian L on: 11/04/2016 11:41 AM   Modules accepted: Orders

## 2016-11-04 NOTE — Addendum Note (Signed)
Addended by: Cynda FamiliaJOHNSON, Salvadore Valvano L on: 11/04/2016 11:48 AM   Modules accepted: Orders

## 2016-11-07 LAB — EPSTEIN-BARR VIRUS VCA, IGM

## 2016-11-07 LAB — EPSTEIN-BARR VIRUS VCA, IGG: EBV VCA IgG: 330 U/mL — ABNORMAL HIGH

## 2016-11-10 ENCOUNTER — Encounter: Payer: Self-pay | Admitting: Family Medicine

## 2016-11-22 ENCOUNTER — Other Ambulatory Visit: Payer: Self-pay | Admitting: Family Medicine

## 2016-11-22 ENCOUNTER — Telehealth: Payer: Self-pay

## 2016-11-22 DIAGNOSIS — L239 Allergic contact dermatitis, unspecified cause: Secondary | ICD-10-CM

## 2016-11-22 DIAGNOSIS — L509 Urticaria, unspecified: Secondary | ICD-10-CM

## 2016-11-22 NOTE — Telephone Encounter (Signed)
Patient husband Cynthia Villa was seen today due to exposure of Strep by his son and while in office he was positive for Strep as well. Dr. Carlynn Purl told patient if his wife was experiencing symptoms she would call in medication for her as well. Patient states she is experiencing the same symptoms and would you please send in a antibiotic to [facility] for her-also she is allergic to Penicillin.

## 2016-11-22 NOTE — Telephone Encounter (Signed)
Patient requesting refill of Ranitidine to [facility].  

## 2016-11-23 ENCOUNTER — Telehealth: Payer: Self-pay | Admitting: Family Medicine

## 2016-11-23 ENCOUNTER — Other Ambulatory Visit: Payer: Self-pay | Admitting: Family Medicine

## 2016-11-23 MED ORDER — AZITHROMYCIN 250 MG PO TABS: ORAL_TABLET | ORAL | 0 refills | Status: DC

## 2016-11-23 NOTE — Telephone Encounter (Signed)
Patient has been notified by voicemail.

## 2016-11-23 NOTE — Telephone Encounter (Signed)
Pt states husband was diagnosed with strep yesterday and she was advised if she starts to have symptoms to call the office and meds will be called in for her. Pt is experiencing sore throat and is asking for antibiotics to be called into [facility].

## 2016-12-18 ENCOUNTER — Other Ambulatory Visit: Payer: Self-pay | Admitting: Family Medicine

## 2016-12-18 DIAGNOSIS — L509 Urticaria, unspecified: Secondary | ICD-10-CM

## 2016-12-18 DIAGNOSIS — L239 Allergic contact dermatitis, unspecified cause: Secondary | ICD-10-CM

## 2017-03-14 ENCOUNTER — Encounter: Payer: Managed Care, Other (non HMO) | Admitting: Family Medicine

## 2017-05-09 ENCOUNTER — Encounter: Payer: Managed Care, Other (non HMO) | Admitting: Family Medicine

## 2017-06-23 ENCOUNTER — Other Ambulatory Visit: Payer: Self-pay | Admitting: Family Medicine

## 2017-06-23 ENCOUNTER — Ambulatory Visit (INDEPENDENT_AMBULATORY_CARE_PROVIDER_SITE_OTHER): Payer: Managed Care, Other (non HMO) | Admitting: Family Medicine

## 2017-06-23 ENCOUNTER — Encounter: Payer: Self-pay | Admitting: Family Medicine

## 2017-06-23 VITALS — BP 124/70 | HR 100 | Temp 99.0°F | Resp 16 | Ht 62.0 in | Wt 214.6 lb

## 2017-06-23 DIAGNOSIS — J0140 Acute pansinusitis, unspecified: Secondary | ICD-10-CM | POA: Diagnosis not present

## 2017-06-23 DIAGNOSIS — R590 Localized enlarged lymph nodes: Secondary | ICD-10-CM

## 2017-06-23 DIAGNOSIS — J029 Acute pharyngitis, unspecified: Secondary | ICD-10-CM | POA: Diagnosis not present

## 2017-06-23 LAB — POCT RAPID STREP A (OFFICE): RAPID STREP A SCREEN: NEGATIVE

## 2017-06-23 MED ORDER — FLUTICASONE PROPIONATE 50 MCG/ACT NA SUSP: 2.0000 | NASAL | 6 refills | Status: DC | PRN

## 2017-06-23 MED ORDER — DOXYCYCLINE HYCLATE 100 MG PO TABS
100.0000 mg | ORAL_TABLET | Freq: Two times a day (BID) | ORAL | 0 refills | Status: AC
Start: 2017-06-23 — End: 2017-06-30

## 2017-06-23 MED ORDER — MOMETASONE FUROATE 50 MCG/ACT NA SUSP: 2.0000 | Freq: Every day | NASAL | 12 refills | Status: DC

## 2017-06-23 NOTE — Patient Instructions (Addendum)

## 2017-06-23 NOTE — Progress Notes (Signed)
Insurance will not cover flonase - Nasonex ordered in its place

## 2017-06-23 NOTE — Progress Notes (Signed)
Name: Cynthia Villa   MRN: [id number]    DOB: 13-Oct-1980   Date:06/23/2017       Progress Note  Subjective  Chief Complaint  Chief Complaint  Patient presents with  . Sinusitis    HPI  Patient presents with concern for sinus pain and pressure x7 days and now having mild to moderate submandibular lymph node swelling x3 days. She endorses low-grade fever, fatigue, nausea, sore throat. No chills, body aches, vomiting or diarrhea, cough, chest pain, or shortness of breath; no headaches, dizziness, or lightheadedness.  Patient Active Problem List   Diagnosis Date Noted  . 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 Urticaria 09/15/2016  . 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 History of thyroid nodule 03/04/2015  . Allergic contact dermatitis 03/03/2015  . Allergic state 03/03/2015  . Allergic to food 03/03/2015  . History of hyperthyroidism 06/17/2011  . Subclinical hypothyroidism 06/17/2011    Social History   Tobacco Use  . Smoking status: Never Smoker  . Smokeless tobacco: Never Used  Substance Use Topics  . Alcohol use: No    Alcohol/week: 0.0 oz     Current Outpatient Medications:  .  hydrOXYzine (ATARAX/VISTARIL) 25 MG tablet, Take 1 tablet (25 mg total) by mouth 2 (two) times daily., Disp: 30 tablet, Rfl: 3 .  levocetirizine (XYZAL) 5 MG tablet, Take 1 tablet (5 mg total) by mouth daily., Disp: 30 tablet, Rfl: 3 .  montelukast (SINGULAIR) 10 MG tablet, TAKE 1 TABLET(10 MG) BY MOUTH AT BEDTIME, Disp: 30 tablet, Rfl: 5 .  PARAGARD INTRAUTERINE COPPER IUD IUD, by Intrauterine route., Disp: , Rfl:  .  pimecrolimus (ELIDEL) 1 % cream, Apply topically 2 (two) times daily., Disp: 100 g, Rfl: 3 .  ranitidine (ZANTAC) 150 MG tablet, TAKE 1 TABLET(150 MG) BY MOUTH TWICE DAILY, Disp: 60 tablet, Rfl: 0 .  triamcinolone cream (KENALOG) 0.1 %, Apply 1 application topically 2 (two) times daily., Disp: 80 g, Rfl: 2 .  doxycycline (VIBRA-TABS) 100 MG tablet, Take 1 tablet (100 mg total) 2 (two)  times daily for 7 days by mouth., Disp: 14 tablet, Rfl: 0 .  fluticasone (FLONASE) 50 MCG/ACT nasal spray, Place 2 sprays as needed into both nostrils. Reported on 03/01/2016, Disp: 16 g, Rfl: 6  Allergies  Allergen Reactions  . Cetirizine Other (See Comments)    Other Reaction: CNS Disorder  . Dog Epithelium   . Monistat  [Miconazole] Nausea And Vomiting  . Orange Fruit [Citrus] Swelling  . Penicillins Hives  . Permethrin     rash  . Sudafed  [Pseudoephedrine]     Other reaction(s): Muscle Pain  . Banana Rash  . Pineapple Rash    ROS  Ten systems reviewed and is negative except as mentioned in HPI  Objective  Vitals:   06/23/17 1041  BP: 124/70  Pulse: 100  Resp: 16  Temp: 99 F (37.2 C)  TempSrc: Oral  SpO2: 98%  Weight: 214 lb 9.6 oz (97.3 kg)  Height: 5\' 2"  (1.575 m)   Body mass index is 39.25 kg/m.  Nursing Note and Vital Signs reviewed.  Physical Exam  Constitutional: Patient appears well-developed and well-nourished. Obese No distress.  HEENT: head atraumatic, normocephalic, pupils equal and reactive to light, EOM's intact, TM's without erythema or bulging, positive for LEFT maxillary and RIGHT frontal sinus pain on palpation, neck supple with bilateral submandibular lymphadenopathy that is moderately tender on palpation, oropharynx erythematous and moist without exudate Cardiovascular: Mildly elevated rate, regular rhythm, S1/S2  present.  No murmur or rub heard. No BLE edema. Pulmonary/Chest: Effort normal and breath sounds clear. No respiratory distress or retractions. Abdominal: Soft and non-tender, bowel sounds present x4 quadrants. Psychiatric: Patient has a normal mood and affect. behavior is normal. Judgment and thought content normal.  Recent Results (from the past 2160 hour(s))  POCT rapid strep A     Status: None   Collection Time: 06/23/17 10:59 AM  Result Value Ref Range   Rapid Strep A Screen Negative Negative     Assessment & Plan  1.  Acute non-recurrent pansinusitis - doxycycline (VIBRA-TABS) 100 MG tablet; Take 1 tablet (100 mg total) 2 (two) times daily for 7 days by mouth.  Dispense: 14 tablet; Refill: 0 - Patient is PCN allergic - fluticasone (FLONASE) 50 MCG/ACT nasal spray; Place 2 sprays as needed into both nostrils. Reported on 03/01/2016  Dispense: 16 g; Refill: 6  2. Lymphadenopathy, submandibular - Ibuprofen PRN OTC for pain and swelling - Patient declines prednisone for lymphadenopathy - will call back in 3-4 days if not improving and will consider prednisone therapy.  3. Sore throat - POCT rapid strep A - Negative - Cool Mist humidifier; warm salt water gargles. Patient declines Viscous lidocaine  -Red flags and when to present for emergency care or RTC including fever >101.11F, chest pain, shortness of breath, new/worsening/un-resolving symptoms, reviewed with patient at time of visit. Follow up and care instructions discussed and provided in AVS.

## 2017-07-05 ENCOUNTER — Encounter: Payer: Self-pay | Admitting: Family Medicine

## 2017-07-05 ENCOUNTER — Ambulatory Visit (INDEPENDENT_AMBULATORY_CARE_PROVIDER_SITE_OTHER): Payer: Managed Care, Other (non HMO) | Admitting: Family Medicine

## 2017-07-05 VITALS — BP 116/90 | HR 98 | Temp 98.8°F | Resp 14 | Ht 62.0 in | Wt 208.0 lb

## 2017-07-05 DIAGNOSIS — Z23 Encounter for immunization: Secondary | ICD-10-CM

## 2017-07-05 DIAGNOSIS — J0100 Acute maxillary sinusitis, unspecified: Secondary | ICD-10-CM | POA: Diagnosis not present

## 2017-07-05 DIAGNOSIS — T3695XA Adverse effect of unspecified systemic antibiotic, initial encounter: Secondary | ICD-10-CM

## 2017-07-05 DIAGNOSIS — B379 Candidiasis, unspecified: Secondary | ICD-10-CM | POA: Diagnosis not present

## 2017-07-05 MED ORDER — FERROUS SULFATE 325 (65 FE) MG PO TABS
325.0000 mg | ORAL_TABLET | Freq: Three times a day (TID) | ORAL | 3 refills | Status: DC
Start: 2017-07-05 — End: 2020-10-23

## 2017-07-05 MED ORDER — FLUCONAZOLE 150 MG PO TABS: 150.0000 mg | ORAL_TABLET | ORAL | 0 refills | Status: DC

## 2017-07-05 MED ORDER — AZITHROMYCIN 500 MG PO TABS: ORAL_TABLET | ORAL | 0 refills | Status: DC

## 2017-07-05 NOTE — Progress Notes (Signed)
Name: Carolee RotaCasey Deman   MRN: [id number]    DOB: [birth year]   Date:07/05/2017       Progress Note  Subjective  Chief Complaint  Chief Complaint  Patient presents with  . Sinusitis    HPI  Sinusitis: she was seen last week, she finished 7 days of doxycycline. She states sinus pressure still present, finally having some post-nasal drainage. No fever or chills. No nausea or vomiting. She states she has noticed some yeast problems, mild vaginal itching.    Patient Active Problem List   Diagnosis Date Noted  . 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 Urticaria 09/15/2016  . 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 History of thyroid nodule 03/04/2015  . Allergic contact dermatitis 03/03/2015  . Allergic state 03/03/2015  . Allergic to food 03/03/2015  . History of hyperthyroidism 06/17/2011  . Subclinical hypothyroidism 06/17/2011    Social History   Tobacco Use  . Smoking status: Never Smoker  . Smokeless tobacco: Never Used  Substance Use Topics  . Alcohol use: No    Alcohol/week: 0.0 oz     Current Outpatient Medications:  .  hydrOXYzine (ATARAX/VISTARIL) 25 MG tablet, Take 1 tablet (25 mg total) by mouth 2 (two) times daily., Disp: 30 tablet, Rfl: 3 .  levocetirizine (XYZAL) 5 MG tablet, Take 1 tablet (5 mg total) by mouth daily., Disp: 30 tablet, Rfl: 3 .  mometasone (NASONEX) 50 MCG/ACT nasal spray, Place 2 sprays daily into the nose., Disp: 17 g, Rfl: 12 .  montelukast (SINGULAIR) 10 MG tablet, TAKE 1 TABLET(10 MG) BY MOUTH AT BEDTIME, Disp: 30 tablet, Rfl: 5 .  PARAGARD INTRAUTERINE COPPER IUD IUD, by Intrauterine route., Disp: , Rfl:  .  pimecrolimus (ELIDEL) 1 % cream, Apply topically 2 (two) times daily., Disp: 100 g, Rfl: 3 .  triamcinolone cream (KENALOG) 0.1 %, Apply 1 application topically 2 (two) times daily., Disp: 80 g, Rfl: 2 .  ranitidine (ZANTAC) 150 MG tablet, TAKE 1 TABLET(150 MG) BY MOUTH TWICE DAILY (Patient not taking: Reported on 07/05/2017), Disp: 60 tablet,  Rfl: 0  Allergies  Allergen Reactions  . Cetirizine Other (See Comments)    Other Reaction: CNS Disorder  . Dog Epithelium   . Monistat  [Miconazole] Nausea And Vomiting  . Orange Fruit [Citrus] Swelling  . Penicillins Hives  . Permethrin     rash  . Sudafed  [Pseudoephedrine]     Other reaction(s): Muscle Pain  . Banana Rash  . Pineapple Rash    ROS  Ten systems reviewed and is negative except as mentioned in HPI   Objective  Vitals:   07/05/17 1027  BP: 116/90  Pulse: 98  Resp: 14  Temp: 98.8 F (37.1 C)  TempSrc: Oral  SpO2: 98%  Weight: 208 lb (94.3 kg)  Height: 5\' 2"  (1.575 m)    Body mass index is 38.04 kg/m.    Physical Exam  Constitutional: Patient appears well-developed and well-nourished. Obese No distress.  HEENT: head atraumatic, normocephalic, pupils equal and reactive to light,  neck supple, throat within normal limits, tender maxillary sinus bilaterally. No lymphadenopathy  Cardiovascular: Normal rate, regular rhythm and normal heart sounds.  No murmur heard. No BLE edema. Pulmonary/Chest: Effort normal and breath sounds normal. No respiratory distress. Abdominal: Soft.  There is no tenderness. Psychiatric: Patient has a normal mood and affect. behavior is normal. Judgment and thought content normal.  Recent Results (from the past 2160 hour(s))  POCT rapid strep A  Status: None   Collection Time: 06/23/17 10:59 AM  Result Value Ref Range   Rapid Strep A Screen Negative Negative     Assessment & Plan  1. Acute non-recurrent maxillary sinusitis  - azithromycin (ZITHROMAX) 500 MG tablet; Daily for 3 days  Dispense: 3 tablet; Refill: 0 May try otc mucinex  2. Antibiotic-induced yeast infection  - fluconazole (DIFLUCAN) 150 MG tablet; Take 1 tablet (150 mg total) by mouth every other day.  Dispense: 3 tablet; Refill: 0

## 2017-08-30 ENCOUNTER — Ambulatory Visit (INDEPENDENT_AMBULATORY_CARE_PROVIDER_SITE_OTHER): Payer: Managed Care, Other (non HMO) | Admitting: Family Medicine

## 2017-08-30 ENCOUNTER — Encounter: Payer: Self-pay | Admitting: Family Medicine

## 2017-08-30 VITALS — BP 126/70 | HR 84 | Temp 99.1°F | Resp 16 | Ht 62.0 in | Wt 210.0 lb

## 2017-08-30 DIAGNOSIS — H9201 Otalgia, right ear: Secondary | ICD-10-CM | POA: Diagnosis not present

## 2017-08-30 DIAGNOSIS — G43009 Migraine without aura, not intractable, without status migrainosus: Secondary | ICD-10-CM | POA: Insufficient documentation

## 2017-08-30 DIAGNOSIS — M26621 Arthralgia of right temporomandibular joint: Secondary | ICD-10-CM

## 2017-08-30 MED ORDER — NAPROXEN 500 MG PO TABS
500.0000 mg | ORAL_TABLET | Freq: Two times a day (BID) | ORAL | 0 refills | Status: DC
Start: 2017-08-30 — End: 2018-05-04

## 2017-08-30 NOTE — Patient Instructions (Signed)
Temporomandibular Joint Syndrome Temporomandibular joint (TMJ) syndrome is a condition that affects the joints between your jaw and your skull. The TMJs are located near your ears and allow your jaw to open and close. These joints and the nearby muscles are involved in all movements of the jaw. People with TMJ syndrome have pain in the area of these joints and muscles. Chewing, biting, or other movements of the jaw can be difficult or painful. TMJ syndrome can be caused by various things. In many cases, the condition is mild and goes away within a few weeks. For some people, the condition can become a long-term problem. What are the causes? Possible causes of TMJ syndrome include:  Grinding your teeth or clenching your jaw. Some people do this when they are under stress.  Arthritis.  Injury to the jaw.  Head or neck injury.  Teeth or dentures that are not aligned well.  In some cases, the cause of TMJ syndrome may not be known. What are the signs or symptoms? The most common symptom is an aching pain on the side of the head in the area of the TMJ. Other symptoms may include:  Pain when moving your jaw, such as when chewing or biting.  Being unable to open your jaw all the way.  Making a clicking sound when you open your mouth.  Headache.  Earache.  Neck or shoulder pain.  How is this diagnosed? Diagnosis can usually be made based on your symptoms, your medical history, and a physical exam. Your health care provider may check the range of motion of your jaw. Imaging tests, such as X-rays or an MRI, are sometimes done. You may need to see your dentist to determine if your teeth and jaw are lined up correctly. How is this treated? TMJ syndrome often goes away on its own. If treatment is needed, the options may include:  Eating soft foods and applying ice or heat.  Medicines to relieve pain or inflammation.  Medicines to relax the muscles.  A splint, bite plate, or mouthpiece  to prevent teeth grinding or jaw clenching.  Relaxation techniques or counseling to help reduce stress.  Transcutaneous electrical nerve stimulation (TENS). This helps to relieve pain by applying an electrical current through the skin.  Acupuncture. This is sometimes helpful to relieve pain.  Jaw surgery. This is rarely needed.  Follow these instructions at home:  Take medicines only as directed by your health care provider.  Eat a soft diet if you are having trouble chewing.  Apply ice to the painful area. ? Put ice in a plastic bag. ? Place a towel between your skin and the bag. ? Leave the ice on for 20 minutes, 2-3 times a day.  Apply a warm compress to the painful area as directed.  Massage your jaw area and perform any jaw stretching exercises as recommended by your health care provider.  If you were given a mouthpiece or bite plate, wear it as directed.  Avoid foods that require a lot of chewing. Do not chew gum.  Keep all follow-up visits as directed by your health care provider. This is important. Contact a health care provider if:  You are having trouble eating.  You have new or worsening symptoms. Get help right away if:  Your jaw locks open or closed. This information is not intended to replace advice given to you by your health care provider. Make sure you discuss any questions you have with your health care provider. Document   Released: 04/26/2001 Document Revised: 03/31/2016 Document Reviewed: 03/06/2014 Elsevier Interactive Patient Education  2018 Elsevier Inc.  

## 2017-08-30 NOTE — Progress Notes (Signed)
Name: Cynthia Villa   MRN: [id number]    DOB: 07-28-1981   Date:08/30/2017       Progress Note  Subjective  Chief Complaint  Chief Complaint  Patient presents with  . Ear Pain    Onset-1 week, right ear is worst than left, hurts all the time, nasal congestion and drainage, low grade fever for a couple of days, headache and sinus pressure. Patient has tried Tylenol otc.     HPI  Right ear pain: she always has congestion , no rhinorrhea, cough or SOB, she noticed right ear pain that is constant for the past week, dull aching, started after an episode of migraine. She also has a history of seeing dentist for toothache without having cavities. 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   Patient Active Problem List   Diagnosis Date Noted  . Migraine headache without aura 08/30/2017  . 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 Urticaria 09/15/2016  . 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 History of thyroid nodule 03/04/2015  . Allergic contact dermatitis 03/03/2015  . Allergic state 03/03/2015  . Allergic to food 03/03/2015  . History of hyperthyroidism 06/17/2011  . Subclinical hypothyroidism 06/17/2011    Social History   Tobacco Use  . Smoking status: Never Smoker  . Smokeless tobacco: Never Used  Substance Use Topics  . Alcohol use: No    Alcohol/week: 0.0 oz     Current Outpatient Medications:  .  ferrous sulfate 325 (65 FE) MG tablet, Take 1 tablet (325 mg total) by mouth 3 (three) times daily with meals., Disp: 90 tablet, Rfl: 3 .  hydrOXYzine (ATARAX/VISTARIL) 25 MG tablet, Take 1 tablet (25 mg total) by mouth 2 (two) times daily. (Patient taking differently: Take 25 mg by mouth as needed for itching. ), Disp: 30 tablet, Rfl: 3 .  levocetirizine (XYZAL) 5 MG tablet, Take 1 tablet (5 mg total) by mouth daily., Disp: 30 tablet, Rfl: 3 .  mometasone (NASONEX) 50 MCG/ACT nasal spray, Place 2 sprays daily into the nose., Disp: 17 g, Rfl: 12 .  montelukast (SINGULAIR) 10 MG  tablet, TAKE 1 TABLET(10 MG) BY MOUTH AT BEDTIME, Disp: 30 tablet, Rfl: 5 .  PARAGARD INTRAUTERINE COPPER IUD IUD, by Intrauterine route., Disp: , Rfl:  .  pimecrolimus (ELIDEL) 1 % cream, Apply topically 2 (two) times daily. (Patient taking differently: Apply 1 application topically as needed. ), Disp: 100 g, Rfl: 3 .  triamcinolone cream (KENALOG) 0.1 %, Apply 1 application topically 2 (two) times daily., Disp: 80 g, Rfl: 2  Allergies  Allergen Reactions  . Cetirizine Other (See Comments)    Other Reaction: CNS Disorder  . Dog Epithelium   . Monistat  [Miconazole] Nausea And Vomiting  . Orange Fruit [Citrus] Swelling  . Penicillins Hives  . Permethrin     rash  . Sudafed  [Pseudoephedrine]     Other reaction(s): Muscle Pain  . Banana Rash  . Pineapple Rash    ROS  Ten systems reviewed and is negative except as mentioned in HPI   Objective  Vitals:   08/30/17 0904  BP: 126/70  Pulse: 84  Resp: 16  Temp: 99.1 F (37.3 C)  TempSrc: Oral  SpO2: 96%  Weight: 210 lb (95.3 kg)  Height: 5\' 2"  (1.575 m)    Body mass index is 38.41 kg/m.    Physical Exam  Constitutional: Patient appears well-developed and well-nourished. Obese  No distress.  HEENT: head atraumatic, normocephalic, pupils equal  and reactive to light, ears normal TM bilaterally, pain during palpation of right TMJ, neck supple, throat within normal limits Cardiovascular: Normal rate, regular rhythm and normal heart sounds.  No murmur heard. No BLE edema. Pulmonary/Chest: Effort normal and breath sounds normal. No respiratory distress. Abdominal: Soft.  There is no tenderness. Psychiatric: Patient has a normal mood and affect. behavior is normal. Judgment and thought content normal.   Recent Results (from the past 2160 hour(s))  POCT rapid strep A     Status: None   Collection Time: 06/23/17 10:59 AM  Result Value Ref Range   Rapid Strep A Screen Negative Negative     Assessment & Plan  1. Otalgia  of right ear  - naproxen (NAPROSYN) 500 MG tablet; Take 1 tablet (500 mg total) by mouth 2 (two) times daily with a meal.  Dispense: 60 tablet; Refill: 0  2. Arthralgia of right temporomandibular joint  Discuss with dentist

## 2017-10-17 ENCOUNTER — Other Ambulatory Visit: Payer: Self-pay | Admitting: Family Medicine

## 2017-10-17 DIAGNOSIS — J0140 Acute pansinusitis, unspecified: Secondary | ICD-10-CM

## 2017-10-17 NOTE — Progress Notes (Signed)
[facility] [address] For Nasonex - after review of orders - she should have a 12 month supply that started in November 2018.  Please call pharmacy and ask if there are issues with this Rx, otherwise no refills warranted at this time.

## 2017-10-24 NOTE — Progress Notes (Signed)
Left message for patient

## 2017-11-01 ENCOUNTER — Ambulatory Visit (INDEPENDENT_AMBULATORY_CARE_PROVIDER_SITE_OTHER): Payer: Managed Care, Other (non HMO) | Admitting: Family Medicine

## 2017-11-01 ENCOUNTER — Encounter: Payer: Self-pay | Admitting: Family Medicine

## 2017-11-01 VITALS — BP 122/64 | HR 106 | Temp 98.6°F | Resp 16 | Ht 62.0 in | Wt 209.3 lb

## 2017-11-01 DIAGNOSIS — Z124 Encounter for screening for malignant neoplasm of cervix: Secondary | ICD-10-CM | POA: Diagnosis not present

## 2017-11-01 DIAGNOSIS — R102 Pelvic and perineal pain: Secondary | ICD-10-CM

## 2017-11-01 DIAGNOSIS — R7989 Other specified abnormal findings of blood chemistry: Secondary | ICD-10-CM | POA: Diagnosis not present

## 2017-11-01 DIAGNOSIS — Z01419 Encounter for gynecological examination (general) (routine) without abnormal findings: Secondary | ICD-10-CM

## 2017-11-01 DIAGNOSIS — Z8632 Personal history of gestational diabetes: Secondary | ICD-10-CM

## 2017-11-01 DIAGNOSIS — E039 Hypothyroidism, unspecified: Secondary | ICD-10-CM

## 2017-11-01 DIAGNOSIS — E038 Other specified hypothyroidism: Secondary | ICD-10-CM

## 2017-11-01 NOTE — Patient Instructions (Signed)
Preventive Care 18-39 Years, Female Preventive care refers to lifestyle choices and visits with your health care provider that can promote health and wellness. What does preventive care include?  A yearly physical exam. This is also called an annual well check.  Dental exams once or twice a year.  Routine eye exams. Ask your health care provider how often you should have your eyes checked.  Personal lifestyle choices, including: ? Daily care of your teeth and gums. ? Regular physical activity. ? Eating a healthy diet. ? Avoiding tobacco and drug use. ? Limiting alcohol use. ? Practicing safe sex. ? Taking vitamin and mineral supplements as recommended by your health care provider. What happens during an annual well check? The services and screenings done by your health care provider during your annual well check will depend on your age, overall health, lifestyle risk factors, and family history of disease. Counseling Your health care provider may ask you questions about your:  Alcohol use.  Tobacco use.  Drug use.  Emotional well-being.  Home and relationship well-being.  Sexual activity.  Eating habits.  Work and work Statistician.  Method of birth control.  Menstrual cycle.  Pregnancy history.  Screening You may have the following tests or measurements:  Height, weight, and BMI.  Diabetes screening. This is done by checking your blood sugar (glucose) after you have not eaten for a while (fasting).  Blood pressure.  Lipid and cholesterol levels. These may be checked every 5 years starting at age 66.  Skin check.  Hepatitis C blood test.  Hepatitis B blood test.  Sexually transmitted disease (STD) testing.  BRCA-related cancer screening. This may be done if you have a family history of breast, ovarian, tubal, or peritoneal cancers.  Pelvic exam and Pap test. This may be done every 3 years starting at age 40. Starting at age 59, this may be done every 5  years if you have a Pap test in combination with an HPV test.  Discuss your test results, treatment options, and if necessary, the need for more tests with your health care provider. Vaccines Your health care provider may recommend certain vaccines, such as:  Influenza vaccine. This is recommended every year.  Tetanus, diphtheria, and acellular pertussis (Tdap, Td) vaccine. You may need a Td booster every 10 years.  Varicella vaccine. You may need this if you have not been vaccinated.  HPV vaccine. If you are 69 or younger, you may need three doses over 6 months.  Measles, mumps, and rubella (MMR) vaccine. You may need at least one dose of MMR. You may also need a second dose.  Pneumococcal 13-valent conjugate (PCV13) vaccine. You may need this if you have certain conditions and were not previously vaccinated.  Pneumococcal polysaccharide (PPSV23) vaccine. You may need one or two doses if you smoke cigarettes or if you have certain conditions.  Meningococcal vaccine. One dose is recommended if you are age 27-21 years and a first-year college student living in a residence hall, or if you have one of several medical conditions. You may also need additional booster doses.  Hepatitis A vaccine. You may need this if you have certain conditions or if you travel or work in places where you may be exposed to hepatitis A.  Hepatitis B vaccine. You may need this if you have certain conditions or if you travel or work in places where you may be exposed to hepatitis B.  Haemophilus influenzae type b (Hib) vaccine. You may need this if  you have certain risk factors.  Talk to your health care provider about which screenings and vaccines you need and how often you need them. This information is not intended to replace advice given to you by your health care provider. Make sure you discuss any questions you have with your health care provider. Document Released: 09/27/2001 Document Revised: 04/20/2016  Document Reviewed: 06/02/2015 Elsevier Interactive Patient Education  [employer].

## 2017-11-01 NOTE — Progress Notes (Signed)
Name: Cynthia Villa   MRN: [id number]    DOB: 01-02-1981   Date:11/01/2017       Progress Note  Subjective  Chief Complaint  Chief Complaint  Patient presents with  . Annual Exam    HPI   Patient presents for annual CPE and discuss abdominal cramping  Low abdominal cramping: started last week, he has a history of UTI, but it also started before her cycle. No dysuria or incontinence.   Hypothyroidism: used to be treated, we will recheck levels, denies weight change or palpitation.    USPSTF grade A and B recommendations  Depression:  Depression screen [facility] 2/9 11/01/2017 06/23/2017 04/11/2016 03/01/2016 10/20/2015  Decreased Interest 0 0 0 0 0  Down, Depressed, Hopeless 0 0 0 0 0  PHQ - 2 Score 0 0 0 0 0   Hypertension: BP Readings from Last 3 Encounters:  11/01/17 122/64  08/30/17 126/70  07/05/17 116/90   Obesity: Wt Readings from Last 3 Encounters:  11/01/17 209 lb 4.8 oz (94.9 kg)  08/30/17 210 lb (95.3 kg)  07/05/17 208 lb (94.3 kg)   BMI Readings from Last 3 Encounters:  11/01/17 38.28 kg/m  08/30/17 38.41 kg/m  07/05/17 38.04 kg/m     HIV, hep B, hep C: N/A STD testing and prevention (chl/gon/syphilis): N/A Intimate partner violence: negative screen  Sexual History/Pain during Intercourse:sexually active, Menstrual History/LMP/Abnormal Bleeding: cycles used to be every 28 days , cycles now between 30-35 days  Incontinence Symptoms: no symptoms.   Advanced Care Planning: A voluntary discussion about advance care planning including the explanation and discussion of advance directives.  Discussed health care proxy and Living will, and the patient was able to identify a health care proxy as husband  Patient does not have a living will at present time. If patient does have living will, I have requested they bring this to the clinic to be scanned in to their chart.  BRCA gene screening: mother had breast cancer in her 43's but not interested  Cervical cancer  screening: today   Lipids:  Lab Results  Component Value Date   CHOL 203 (H) 03/01/2016   CHOL 188 03/04/2015   CHOL 150 01/17/2013   Lab Results  Component Value Date   HDL 61 03/01/2016   HDL 55 03/04/2015   HDL 62 01/17/2013   Lab Results  Component Value Date   LDLCALC 117 03/01/2016   LDLCALC 110 (H) 03/04/2015   LDLCALC 73 01/17/2013   Lab Results  Component Value Date   TRIG 125 03/01/2016   TRIG 117 03/04/2015   TRIG 73 01/17/2013   Lab Results  Component Value Date   CHOLHDL 3.3 03/01/2016   CHOLHDL 3.4 03/04/2015   No results found for: LDLDIRECT  Glucose:  Glucose  Date Value Ref Range Status  03/04/2015 89 65 - 99 mg/dL Final   Glucose, Bld  Date Value Ref Range Status  04/11/2016 102 (H) 65 - 99 mg/dL Final  03/01/2016 99 65 - 99 mg/dL Final     Patient Active Problem List   Diagnosis Date Noted  . Migraine headache without aura 08/30/2017  . 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 Urticaria 09/15/2016  . 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 History of thyroid nodule 03/04/2015  . Allergic contact dermatitis 03/03/2015  . Allergic state 03/03/2015  . Allergic to food 03/03/2015  . History of hyperthyroidism 06/17/2011  . Subclinical hypothyroidism 06/17/2011    Past Surgical History:  Procedure Laterality Date  .  WRIST FRACTURE SURGERY Left 09/08/09   Repaired- August 2004    Family History  Problem Relation Age of Onset  . Diabetes Mother        DM-2nd to chronic pancreatitis  . Alcohol abuse Mother   . Cancer Mother        Breast  . Cholelithiasis Mother   . Lung cancer Mother        Tobacco smoker  . COPD Mother   . Sleep apnea Father   . Hypertension Father   . PKU Son   . Polycystic ovary syndrome Sister   . Cholelithiasis Sister   . COPD Maternal Grandmother   . Emphysema Maternal Grandmother        Tobacco smoker  . Prostate cancer Paternal Grandfather   . Breast cancer Cousin   . Cancer Paternal Aunt      Social History   Socioeconomic History  . Marital status: Married    Spouse name: Not on file  . Number of children: 5  . Years of education: Not on file  . Highest education level: Bachelor's degree (e.g., BA, AB, BS)  Social Needs  . Financial resource strain: Not hard at all  . Food insecurity - worry: Never true  . Food insecurity - inability: Never true  . Transportation needs - medical: No  . Transportation needs - non-medical: No  Occupational History  . Not on file  Tobacco Use  . Smoking status: Never Smoker  . Smokeless tobacco: Never Used  Substance and Sexual Activity  . Alcohol use: No    Alcohol/week: 0.0 oz  . Drug use: No  . Sexual activity: Yes    Partners: Male    Birth control/protection: IUD  Other Topics Concern  . Not on file  Social History Narrative   She is married, 3 biological children and 2 adopted children      Current Outpatient Medications:  .  ferrous sulfate 325 (65 FE) MG tablet, Take 1 tablet (325 mg total) by mouth 3 (three) times daily with meals., Disp: 90 tablet, Rfl: 3 .  montelukast (SINGULAIR) 10 MG tablet, TAKE 1 TABLET(10 MG) BY MOUTH AT BEDTIME, Disp: 30 tablet, Rfl: 5 .  naproxen (NAPROSYN) 500 MG tablet, Take 1 tablet (500 mg total) by mouth 2 (two) times daily with a meal., Disp: 60 tablet, Rfl: 0 .  PARAGARD INTRAUTERINE COPPER IUD IUD, by Intrauterine route., Disp: , Rfl:  .  pimecrolimus (ELIDEL) 1 % cream, Apply topically 2 (two) times daily. (Patient taking differently: Apply 1 application topically as needed. ), Disp: 100 g, Rfl: 3 .  triamcinolone cream (KENALOG) 0.1 %, Apply 1 application topically 2 (two) times daily., Disp: 80 g, Rfl: 2 .  hydrOXYzine (ATARAX/VISTARIL) 25 MG tablet, Take 1 tablet (25 mg total) by mouth 2 (two) times daily. (Patient not taking: Reported on 11/01/2017), Disp: 30 tablet, Rfl: 3 .  levocetirizine (XYZAL) 5 MG tablet, Take 1 tablet (5 mg total) by mouth daily. (Patient not taking:  Reported on 11/01/2017), Disp: 30 tablet, Rfl: 3 .  mometasone (NASONEX) 50 MCG/ACT nasal spray, Place 2 sprays daily into the nose. (Patient not taking: Reported on 11/01/2017), Disp: 17 g, Rfl: 12  Allergies  Allergen Reactions  . Cetirizine Other (See Comments)    Other Reaction: CNS Disorder  . Dog Epithelium   . Monistat  [Miconazole] Nausea And Vomiting  . Orange Fruit [Citrus] Swelling  . Penicillins Hives  . Permethrin     rash  .  Sudafed  [Pseudoephedrine]     Other reaction(s): Muscle Pain  . Banana Rash  . Pineapple Rash     ROS   Constitutional: Negative for fever or weight change.  Respiratory: Negative for cough and shortness of breath.   Cardiovascular: Negative for chest pain or palpitations.  Gastrointestinal: Negative for abdominal pain, no bowel changes.  Musculoskeletal: Negative for gait problem or joint swelling.  Skin: positive  for rash.  Neurological: Negative for dizziness or headache.  No other specific complaints in a complete review of systems (except as listed in HPI above).   Objective  Vitals:   11/01/17 1038  BP: 122/64  Pulse: (!) 106  Resp: 16  Temp: 98.6 F (37 C)  TempSrc: Oral  SpO2: 97%  Weight: 209 lb 4.8 oz (94.9 kg)  Height: _0  (1.575 m)    Body mass index is 38.28 kg/m.  Physical Exam  Constitutional: Patient appears well-developed and well-nourished. No distress.  HENT: Head: Normocephalic and atraumatic. Ears: B TMs ok, no erythema or effusion; Nose: Nose normal. Mouth/Throat: Oropharynx is clear and moist. No oropharyngeal exudate.  Eyes: Conjunctivae and EOM are normal. Pupils are equal, round, and reactive to light. No scleral icterus.  Neck: Normal range of motion. Neck supple. No JVD present. No thyromegaly present.  Cardiovascular: Normal rate, regular rhythm and normal heart sounds.  No murmur heard. No BLE edema. Pulmonary/Chest: Effort normal and breath sounds normal. No respiratory distress. Abdominal:  Soft. Bowel sounds are normal, no distension. There is no tenderness. no masses Breast: no lumps or masses, no nipple discharge or rashes FEMALE GENITALIA:  External genitalia normal External urethra normal Vaginal vault normal without discharge or lesions Cervix normal without discharge or lesions, IUD in place  Bimanual exam normal without masses RECTAL: not done Musculoskeletal: Normal range of motion, no joint effusions. No gross deformities Neurological: he is alert and oriented to person, place, and time. No cranial nerve deficit. Coordination, balance, strength, speech and gait are normal.  Skin: Skin is warm and dry. She has mild eczematous patch on right hand Psychiatric: Patient has a normal mood and affect. behavior is normal. Judgment and thought content normal.    PHQ2/9: Depression screen [facility] 2/9 11/01/2017 06/23/2017 04/11/2016 03/01/2016 10/20/2015  Decreased Interest 0 0 0 0 0  Down, Depressed, Hopeless 0 0 0 0 0  PHQ - 2 Score 0 0 0 0 0     Fall Risk: Fall Risk  11/01/2017 07/05/2017 06/23/2017 04/11/2016 03/01/2016  Falls in the past year? _1   Number falls in past yr: - - - - -  Comment - - - - -  Injury with Fall? - - - - -     Functional Status Survey: Is the patient deaf or have difficulty hearing?: No Does the patient have difficulty seeing, even when wearing glasses/contacts?: No Does the patient have difficulty concentrating, remembering, or making decisions?: No Does the patient have difficulty walking or climbing stairs?: No Does the patient have difficulty dressing or bathing?: No Does the patient have difficulty doing errands alone such as visiting a doctor's office or shopping?: No   Assessment & Plan  1. Well woman exam  Discussed importance of 150 minutes of physical activity weekly, eat two servings of fish weekly, eat one serving of tree nuts ( cashews, pistachios, pecans, almonds.Marland Kitchen) every other day, eat 6 servings of fruit/vegetables  daily and drink plenty of water and avoid sweet beverages.  - CBC with  Differential/Platelet - COMPLETE METABOLIC PANEL WITH GFR - Hemoglobin A1c - Lipid panel  2. Cervical cancer screening  - Pap IG and HPV (high risk) DNA detection  3. Pelvic cramping  - CULTURE, URINE COMPREHENSIVE  4. Abnormal TSH  - Thyroid Panel With TSH  5. Subclinical hypothyroidism

## 2017-11-02 LAB — LIPID PANEL
CHOL/HDL RATIO: 3.1 (calc) (ref <5.0)
Cholesterol: 157 mg/dL (ref <200)
HDL: 50 mg/dL — AB (ref >50)
LDL Cholesterol (Calc): 84 mg/dL (calc)
NON-HDL CHOLESTEROL (CALC): 107 mg/dL (ref <130)
Triglycerides: 135 mg/dL (ref <150)

## 2017-11-02 LAB — CBC WITH DIFFERENTIAL/PLATELET
BASOS ABS: 50 {cells}/uL (ref 0–200)
Basophils Relative: 0.7 %
EOS ABS: 71 {cells}/uL (ref 15–500)
EOS PCT: 1 %
HCT: 40.2 % (ref 35.0–45.0)
Hemoglobin: 13.6 g/dL (ref 11.7–15.5)
Lymphs Abs: 1669 cells/uL (ref 850–3900)
MCH: 30 pg (ref 27.0–33.0)
MCHC: 33.8 g/dL (ref 32.0–36.0)
MCV: 88.5 fL (ref 80.0–100.0)
MONOS PCT: 4.5 %
MPV: 11.2 fL (ref 7.5–12.5)
NEUTROS PCT: 70.3 %
Neutro Abs: 4991 cells/uL (ref 1500–7800)
Platelets: 290 10*3/uL (ref 140–400)
RBC: 4.54 10*6/uL (ref 3.80–5.10)
RDW: 13.5 % (ref 11.0–15.0)
Total Lymphocyte: 23.5 %
WBC mixed population: 320 cells/uL (ref 200–950)
WBC: 7.1 10*3/uL (ref 3.8–10.8)

## 2017-11-02 LAB — COMPLETE METABOLIC PANEL WITH GFR
AG Ratio: 1.5 (calc) (ref 1.0–2.5)
ALKALINE PHOSPHATASE (APISO): 52 U/L (ref 33–115)
ALT: 37 U/L — ABNORMAL HIGH (ref 6–29)
AST: 34 U/L — AB (ref 10–30)
Albumin: 4.2 g/dL (ref 3.6–5.1)
BILIRUBIN TOTAL: 0.4 mg/dL (ref 0.2–1.2)
BUN: 10 mg/dL (ref 7–25)
CHLORIDE: 105 mmol/L (ref 98–110)
CO2: 28 mmol/L (ref 20–32)
CREATININE: 1.01 mg/dL (ref 0.50–1.10)
Calcium: 9.1 mg/dL (ref 8.6–10.2)
GFR, Est African American: 84 mL/min/{1.73_m2} (ref >=60)
GFR, Est Non African American: 72 mL/min/{1.73_m2} (ref >=60)
GLOBULIN: 2.8 g/dL (ref 1.9–3.7)
GLUCOSE: 95 mg/dL (ref 65–99)
Potassium: 4.1 mmol/L (ref 3.5–5.3)
SODIUM: 139 mmol/L (ref 135–146)
Total Protein: 7 g/dL (ref 6.1–8.1)

## 2017-11-02 LAB — HEMOGLOBIN A1C
HEMOGLOBIN A1C: 5.8 %{Hb} — AB (ref <5.7)
MEAN PLASMA GLUCOSE: 120 (calc)
eAG (mmol/L): 6.6 (calc)

## 2017-11-02 LAB — THYROID PANEL WITH TSH
FREE THYROXINE INDEX: 2.9 (ref 1.4–3.8)
T3 UPTAKE: 27 % (ref 22–35)
T4 TOTAL: 10.7 ug/dL (ref 5.1–11.9)
TSH: 1.18 m[IU]/L

## 2017-11-03 LAB — PAP IG AND HPV HIGH-RISK: HPV DNA HIGH RISK: NOT DETECTED

## 2017-11-03 LAB — CULTURE, URINE COMPREHENSIVE
MICRO NUMBER:: 90354539
SPECIMEN QUALITY:: ADEQUATE

## 2017-11-05 ENCOUNTER — Other Ambulatory Visit: Payer: Self-pay | Admitting: Family Medicine

## 2017-11-05 MED ORDER — NITROFURANTOIN MONOHYD MACRO 100 MG PO CAPS: 100.0000 mg | ORAL_CAPSULE | Freq: Two times a day (BID) | ORAL | 0 refills | Status: DC

## 2018-02-14 ENCOUNTER — Encounter: Payer: Self-pay | Admitting: Family Medicine

## 2018-02-14 ENCOUNTER — Ambulatory Visit (INDEPENDENT_AMBULATORY_CARE_PROVIDER_SITE_OTHER): Payer: Managed Care, Other (non HMO) | Admitting: Family Medicine

## 2018-02-14 VITALS — BP 118/70 | HR 96 | Temp 98.4°F | Resp 16 | Ht 62.0 in | Wt 206.3 lb

## 2018-02-14 DIAGNOSIS — J3089 Other allergic rhinitis: Secondary | ICD-10-CM | POA: Diagnosis not present

## 2018-02-14 DIAGNOSIS — R21 Rash and other nonspecific skin eruption: Secondary | ICD-10-CM | POA: Diagnosis not present

## 2018-02-14 DIAGNOSIS — H9201 Otalgia, right ear: Secondary | ICD-10-CM

## 2018-02-14 MED ORDER — PIMECROLIMUS 1 % EX CREA: TOPICAL_CREAM | Freq: Two times a day (BID) | CUTANEOUS | 3 refills | Status: DC

## 2018-02-14 MED ORDER — AZELASTINE-FLUTICASONE 137-50 MCG/ACT NA SUSP: 2.0000 | Freq: Every day | NASAL | 3 refills | Status: DC

## 2018-02-14 MED ORDER — TRIAMCINOLONE ACETONIDE 0.1 % EX CREA
1.0000 | TOPICAL_CREAM | Freq: Two times a day (BID) | CUTANEOUS | 2 refills | Status: AC
Start: 2018-02-14 — End: ?

## 2018-02-14 NOTE — Patient Instructions (Signed)
Contact Dermatitis Dermatitis is redness, soreness, and swelling (inflammation) of the skin. Contact dermatitis is a reaction to certain substances that touch the skin. You either touched something that irritated your skin, or you have allergies to something you touched. Follow these instructions at home: Skin Care  Moisturize your skin as needed.  Apply cool compresses to the affected areas.  Try taking a bath with: ? Epsom salts. Follow the instructions on the package. You can get these at a pharmacy or grocery store. ? Baking soda. Pour a small amount into the bath as told by your doctor. ? Colloidal oatmeal. Follow the instructions on the package. You can get this at a pharmacy or grocery store.  Try applying baking soda paste to your skin. Stir water into baking soda until it looks like paste.  Do not scratch your skin.  Bathe less often.  Bathe in lukewarm water. Avoid using hot water. Medicines  Take or apply over-the-counter and prescription medicines only as told by your doctor.  If you were prescribed an antibiotic medicine, take or apply your antibiotic as told by your doctor. Do not stop taking the antibiotic even if your condition starts to get better. General instructions  Keep all follow-up visits as told by your doctor. This is important.  Avoid the substance that caused your reaction. If you do not know what caused it, keep a journal to try to track what caused it. Write down: ? What you eat. ? What cosmetic products you use. ? What you drink. ? What you wear in the affected area. This includes jewelry.  If you were given a bandage (dressing), take care of it as told by your doctor. This includes when to change and remove it. Contact a doctor if:  You do not get better with treatment.  Your condition gets worse.  You have signs of infection such as: ? Swelling. ? Tenderness. ? Redness. ? Soreness. ? Warmth.  You have a fever.  You have new  symptoms. Get help right away if:  You have a very bad headache.  You have neck pain.  Your neck is stiff.  You throw up (vomit).  You feel very sleepy.  You see red streaks coming from the affected area.  Your bone or joint underneath the affected area becomes painful after the skin has healed.  The affected area turns darker.  You have trouble breathing. This information is not intended to replace advice given to you by your health care provider. Make sure you discuss any questions you have with your health care provider. Document Released: 05/29/2009 Document Revised: 01/07/2016 Document Reviewed: 12/17/2014 Elsevier Interactive Patient Education  2018 [employer] Inc[employer]  

## 2018-02-14 NOTE — Progress Notes (Signed)
Name: Cynthia Villa   MRN: [id number]    DOB: 04/14/1981   Date:02/14/2018       Progress Note  Subjective  Chief Complaint  Chief Complaint  Patient presents with  . Rash    on abdomen for 5 days, had pulled tick off   . Fever    low grade fever 99.6 to 100.5  . Ear Pain    HPI  Pt presents with concern for fever with rash - she found a tick in her bra (not embedded in her skin) about 1.5 weeks ago, developed a rash at the site for a few days that went away, then she developed a rash on her abdomen/pelvic that is itchy but not painful 2-3 days later that has not gone away.  She also notes finding a bite on her back around the same time that was erythematous but did not develop a bullseye appearance.  She has not noticed any bullseye rashes.  She does endorse low grade fevers for about a week 99.6-100.40F and LEFT ear pain (may be related to TMJ).  Denies chills, night sweats, NVD, body aches, no recent travel.  She has a history of very sensitive skin with associated urticaria.    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  She has been using OTC flonase along with singulair, she has not been taking Xyzal daily because it made her a little drowsy.  Would like to try a different nasal spray.  Patient Active Problem List   Diagnosis Date Noted  . History of gestational diabetes 11/01/2017  . Migraine headache without aura 08/30/2017  . 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 Urticaria 09/15/2016  . 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 History of thyroid nodule 03/04/2015  . Allergic contact dermatitis 03/03/2015  . Allergic state 03/03/2015  . Allergic to food 03/03/2015  . History of hyperthyroidism 06/17/2011  . Subclinical hypothyroidism 06/17/2011    Social History   Tobacco Use  . Smoking status: Never Smoker  . Smokeless tobacco: Never Used  Substance Use Topics  . Alcohol use: No    Alcohol/week: 0.0 oz      Current Outpatient Medications:  .  ferrous sulfate 325 (65 FE) MG tablet, Take 1 tablet (325 mg total) by mouth 3 (three) times daily with meals., Disp: 90 tablet, Rfl: 3 .  hydrOXYzine (ATARAX/VISTARIL) 25 MG tablet, Take 1 tablet (25 mg total) by mouth 2 (two) times daily., Disp: 30 tablet, Rfl: 3 .  levocetirizine (XYZAL) 5 MG tablet, Take 1 tablet (5 mg total) by mouth daily., Disp: 30 tablet, Rfl: 3 .  montelukast (SINGULAIR) 10 MG tablet, TAKE 1 TABLET(10 MG) BY MOUTH AT BEDTIME, Disp: 30 tablet, Rfl: 5 .  naproxen (NAPROSYN) 500 MG tablet, Take 1 tablet (500 mg total) by mouth 2 (two) times daily with a meal., Disp: 60 tablet, Rfl: 0 .  PARAGARD INTRAUTERINE COPPER IUD IUD, by Intrauterine route., Disp: , Rfl:  .  Azelastine-Fluticasone 137-50 MCG/ACT SUSP, Place 2 sprays into the nose daily., Disp: 23 g, Rfl: 3 .  pimecrolimus (ELIDEL) 1 % cream, Apply topically 2 (two) times daily., Disp: 100 g, Rfl: 3 .  triamcinolone cream (KENALOG) 0.1 %, Apply 1 application topically 2 (two) times daily., Disp: 80 g, Rfl: 2  Allergies  Allergen Reactions  . Cetirizine Other (See Comments)    Other Reaction: CNS Disorder  . Dog Epithelium   .  Monistat  [Miconazole] Nausea And Vomiting  . Orange Fruit [Citrus] Swelling  . Penicillins Hives  . Permethrin     rash  . Sudafed  [Pseudoephedrine]     Other reaction(s): Muscle Pain  . Banana Rash  . Pineapple Rash    ROS  Constitutional: Negative for fever or weight change.  Respiratory: Negative for cough and shortness of breath.   Cardiovascular: Negative for chest pain or palpitations.  Gastrointestinal: Negative for abdominal pain, no bowel changes.  Musculoskeletal: Negative for gait problem or joint swelling.  Skin: Negative for rash.  Neurological: Negative for dizziness or headache.  No other specific complaints in a complete review of systems (except as listed in HPI above).  Objective  Vitals:   02/14/18 1309 02/14/18  1330  BP: 118/70   Pulse: (!) 104 96  Resp: 16   Temp: 98.4 F (36.9 C)   TempSrc: Oral   SpO2: 96%   Weight: 206 lb 4.8 oz (93.6 kg)   Height: 5\' 2"  (1.575 m)    Body mass index is 37.73 kg/m.  Nursing Note and Vital Signs reviewed.  Physical Exam  Constitutional: Patient appears well-developed and well-nourished. Obese. No distress.  HEENT: head atraumatic, normocephalic, pupils equal and reactive to light, EOM's intact, TM's without erythema or bulging bilaterally Cardiovascular: Normal rate, regular rhythm, S1/S2 present.  No murmur or rub heard. No BLE edema. Pulmonary/Chest: Effort normal and breath sounds clear. No respiratory distress or retractions. Skin: Large patch of erythematous confluent macular rash to right mid abdomen and moderately sized patch to LEFT groin area. Non-tender, no exudate; some excoriation noted.  Psychiatric: Patient has a normal mood and affect. behavior is normal. Judgment and thought content normal.  No results found for this or any previous visit (from the past 72 hour(s)).  Assessment & Plan  1. Rash in adult - Advised to monitor closely, if fever returns she needs to return for additional testing. Pt has long history of very reactive skin, I recommend we start with Kenalog topically as this has worked for her in the past.  If not improving after several days, she will call back to determine the best course of action (re-evaluation in the clinic, referral to dermatology).  I also recommend she take hydroxyzine PRN for itching as she does have excoriation on both patches and she does endorse nighttime itching. - triamcinolone cream (KENALOG) 0.1 %; Apply 1 application topically 2 (two) times daily.  Dispense: 80 g; Refill: 2 - pimecrolimus (ELIDEL) 1 % cream; Apply topically 2 (two) times daily.  Dispense: 100 g; Refill: 3  4. Otalgia, right - Azelastine-Fluticasone 137-50 MCG/ACT SUSP; Place 2 sprays into the nose daily.  Dispense: 23 g;  Refill: 3  5. Non-seasonal allergic rhinitis, unspecified trigger - Azelastine-Fluticasone 137-50 MCG/ACT SUSP; Place 2 sprays into the nose daily.  Dispense: 23 g; Refill: 3  -Red flags and when to present for emergency care or RTC including fever >101.23F, chest pain, shortness of breath, new/worsening/un-resolving symptoms, severe itching, significant worsening of the rash reviewed with patient at time of visit. Follow up and care instructions discussed and provided in AVS.

## 2018-05-04 ENCOUNTER — Ambulatory Visit (INDEPENDENT_AMBULATORY_CARE_PROVIDER_SITE_OTHER): Payer: Managed Care, Other (non HMO) | Admitting: Family Medicine

## 2018-05-04 ENCOUNTER — Encounter: Payer: Self-pay | Admitting: Family Medicine

## 2018-05-04 VITALS — BP 118/66 | HR 103 | Temp 98.4°F | Resp 16 | Ht 62.0 in | Wt 206.2 lb

## 2018-05-04 DIAGNOSIS — J3089 Other allergic rhinitis: Secondary | ICD-10-CM | POA: Diagnosis not present

## 2018-05-04 DIAGNOSIS — E038 Other specified hypothyroidism: Secondary | ICD-10-CM

## 2018-05-04 DIAGNOSIS — L236 Allergic contact dermatitis due to food in contact with the skin: Secondary | ICD-10-CM

## 2018-05-04 DIAGNOSIS — E039 Hypothyroidism, unspecified: Secondary | ICD-10-CM

## 2018-05-04 DIAGNOSIS — Z23 Encounter for immunization: Secondary | ICD-10-CM

## 2018-05-04 DIAGNOSIS — L308 Other specified dermatitis: Secondary | ICD-10-CM

## 2018-05-04 DIAGNOSIS — R7303 Prediabetes: Secondary | ICD-10-CM

## 2018-05-04 DIAGNOSIS — G43009 Migraine without aura, not intractable, without status migrainosus: Secondary | ICD-10-CM

## 2018-05-04 DIAGNOSIS — R748 Abnormal levels of other serum enzymes: Secondary | ICD-10-CM

## 2018-05-04 NOTE — Progress Notes (Signed)
Name: Cynthia Villa   MRN: [id number]    DOB: Dec 31, 1980   Date:05/04/2018       Progress Note  Subjective  Chief Complaint  Chief Complaint  Patient presents with  . Follow-up    6 mth f/u  . Hypothyroidism  . Immunizations    HPI  Food allergy dermatitis: she has noticed that once she eliminated pineapple, oranges and bananas from her diet the eczema has resolved. Not using any medication at this time  Subclinical hypothyroidism: we will recheck labs today. No symptoms such as weight changes or change in bowel movements  Migraine headache: she has monthly during her cycle, started at age 38 yo, she has tried multiple medications but causes vomiting. She states she goes to a dark room and sleeps, usually improves after she vomits. It is not as debilitating like it used to be.   Pre-diabetes: last hgbA1C improved, down to 5.8%, previously 6.2% and has a history of gestational diabetes. Denies polyphagia, polydipsia or polyuria.   Patient Active Problem List   Diagnosis Date Noted  . History of gestational diabetes 11/01/2017  . Migraine headache without aura 08/30/2017  . 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 Urticaria 09/15/2016  . 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 History of thyroid nodule 03/04/2015  . Allergic contact dermatitis 03/03/2015  . Allergic state 03/03/2015  . Allergic to food 03/03/2015  . History of hyperthyroidism 06/17/2011  . Subclinical hypothyroidism 06/17/2011    Past Surgical History:  Procedure Laterality Date  . WRIST FRACTURE SURGERY Left 09/08/09   Repaired- August 2004    Family History  Problem Relation Age of Onset  . Diabetes Mother        DM-2nd to chronic pancreatitis  . Alcohol abuse Mother   . Cancer Mother        Breast  . Cholelithiasis Mother   . Lung cancer Mother        Tobacco smoker  . COPD Mother   . Sleep apnea Father   . Hypertension Father   . PKU Son   . Polycystic ovary syndrome Sister   . Cholelithiasis Sister    . COPD Maternal Grandmother   . Emphysema Maternal Grandmother        Tobacco smoker  . Prostate cancer Paternal Grandfather   . Breast cancer Cousin   . Cancer Paternal Aunt     Social History   Socioeconomic History  . Marital status: Married    Spouse name: Ladene Artist  . Number of children: 5  . Years of education: Not on file  . Highest education level: Bachelor's degree (e.g., BA, AB, BS)  Occupational History  . Not on file  Social Needs  . Financial resource strain: Not hard at all  . Food insecurity:    Worry: Never true    Inability: Never true  . Transportation needs:    Medical: No    Non-medical: No  Tobacco Use  . Smoking status: Never Smoker  . Smokeless tobacco: Never Used  Substance and Sexual Activity  . Alcohol use: No    Alcohol/week: 0.0 standard drinks  . Drug use: No  . Sexual activity: Yes    Partners: Male    Birth control/protection: IUD  Lifestyle  . Physical activity:    Days per week: 0 days    Minutes per session: 0 min  . Stress: Not at all  Relationships  . Social connections:    Talks on phone: More than three times  a week    Gets together: Once a week    Attends religious service: Never    Active member of club or organization: No    Attends meetings of clubs or organizations: Never    Relationship status: Married  . Intimate partner violence:    Fear of current or ex partner: No    Emotionally abused: No    Physically abused: No    Forced sexual activity: No  Other Topics Concern  . Not on file  Social History Narrative   She is married, 3 biological children and 2 adopted children      Current Outpatient Medications:  .  Azelastine-Fluticasone 137-50 MCG/ACT SUSP, Place 2 sprays into the nose daily., Disp: 23 g, Rfl: 3 .  ferrous sulfate 325 (65 FE) MG tablet, Take 1 tablet (325 mg total) by mouth 3 (three) times daily with meals., Disp: 90 tablet, Rfl: 3 .  montelukast (SINGULAIR) 10 MG tablet, TAKE 1 TABLET(10 MG) BY  MOUTH AT BEDTIME, Disp: 30 tablet, Rfl: 5 .  PARAGARD INTRAUTERINE COPPER IUD IUD, by Intrauterine route., Disp: , Rfl:  .  levocetirizine (XYZAL) 5 MG tablet, Take 1 tablet (5 mg total) by mouth daily. (Patient not taking: Reported on 05/04/2018), Disp: 30 tablet, Rfl: 3 .  pimecrolimus (ELIDEL) 1 % cream, Apply topically 2 (two) times daily. (Patient not taking: Reported on 05/04/2018), Disp: 100 g, Rfl: 3 .  triamcinolone cream (KENALOG) 0.1 %, Apply 1 application topically 2 (two) times daily. (Patient not taking: Reported on 05/04/2018), Disp: 80 g, Rfl: 2  Allergies  Allergen Reactions  . Cetirizine Other (See Comments)    Other Reaction: CNS Disorder  . Dog Epithelium   . Monistat  [Miconazole] Nausea And Vomiting  . Orange Fruit [Citrus] Swelling  . Penicillins Hives  . Permethrin     rash  . Sudafed  [Pseudoephedrine]     Other reaction(s): Muscle Pain  . Banana Rash  . Pineapple Rash    I personally reviewed active problem list, medication list, allergies, family history, social history with the patient/caregiver today.   ROS  Constitutional: Negative for fever or weight change.  Respiratory: Negative for cough and shortness of breath.   Cardiovascular: Negative for chest pain or palpitations.  Gastrointestinal: Negative for abdominal pain, no bowel changes.  Musculoskeletal: Negative for gait problem or joint swelling.  Skin: Negative for rash.  Neurological: Negative for dizziness or headache.  No other specific complaints in a complete review of systems (except as listed in HPI above).   Objective  Vitals:   05/04/18 1147  BP: 118/66  Pulse: (!) 103  Resp: 16  Temp: 98.4 F (36.9 C)  TempSrc: Oral  SpO2: 99%  Weight: 206 lb 3.2 oz (93.5 kg)  Height: 5\' 2"  (1.575 m)    Body mass index is 37.71 kg/m.  Physical Exam  Constitutional: Patient appears well-developed and well-nourished. Obese No distress.  HEENT: head atraumatic, normocephalic, pupils  equal and reactive to light,  neck supple, throat within normal limits Cardiovascular: Normal rate, regular rhythm and normal heart sounds.  No murmur heard. No BLE edema. Pulmonary/Chest: Effort normal and breath sounds normal. No respiratory distress. Abdominal: Soft.  There is no tenderness. Skin: no rashes at this time Psychiatric: Patient has a normal mood and affect. behavior is normal. Judgment and thought content normal.  PHQ2/9: Depression screen [facility] 2/9 11/01/2017 06/23/2017 04/11/2016 03/01/2016 10/20/2015  Decreased Interest 0 0 0 0 0  Down, Depressed, Hopeless 0  0 0 0 0  PHQ - 2 Score 0 0 0 0 0     Fall Risk: Fall Risk  11/01/2017 07/05/2017 06/23/2017 04/11/2016 03/01/2016  Falls in the past year? No No No No No  Number falls in past yr: - - - - -  Comment - - - - -  Injury with Fall? - - - - -     Functional Status Survey: Is the patient deaf or have difficulty hearing?: No Does the patient have difficulty seeing, even when wearing glasses/contacts?: No Does the patient have difficulty concentrating, remembering, or making decisions?: No Does the patient have difficulty walking or climbing stairs?: No Does the patient have difficulty dressing or bathing?: No Does the patient have difficulty doing errands alone such as visiting a doctor's office or shopping?: No    Assessment & Plan  1. Other eczema  Doing great, she has food allergies   2. Need for influenza vaccination  - Flu Vaccine QUAD 6+ mos PF IM (Fluarix Quad PF)  3. Non-seasonal allergic rhinitis, unspecified trigger  Continue medication doing well   4. Food allergic contact dermatitis   5. Subclinical hypothyroidism  - Thyroid Panel With TSH  6. Elevated liver enzymes  - COMPLETE METABOLIC PANEL WITH GFR  7. Pre-diabetes  - Hemoglobin A1c  8. Migraine without aura and without status migrainosus, not intractable  - COMPLETE METABOLIC PANEL WITH GFR Discussed injectable imitrex, but she  would like to hold off for now.

## 2018-05-05 LAB — COMPLETE METABOLIC PANEL WITH GFR
AG Ratio: 1.6 (calc) (ref 1.0–2.5)
ALBUMIN MSPROF: 4.3 g/dL (ref 3.6–5.1)
ALKALINE PHOSPHATASE (APISO): 60 U/L (ref 33–115)
ALT: 34 U/L — ABNORMAL HIGH (ref 6–29)
AST: 28 U/L (ref 10–30)
BUN: 10 mg/dL (ref 7–25)
CALCIUM: 9.4 mg/dL (ref 8.6–10.2)
CO2: 30 mmol/L (ref 20–32)
CREATININE: 0.98 mg/dL (ref 0.50–1.10)
Chloride: 105 mmol/L (ref 98–110)
GFR, EST AFRICAN AMERICAN: 86 mL/min/{1.73_m2} (ref >=60)
GFR, EST NON AFRICAN AMERICAN: 74 mL/min/{1.73_m2} (ref >=60)
GLUCOSE: 87 mg/dL (ref 65–139)
Globulin: 2.7 g/dL (calc) (ref 1.9–3.7)
Potassium: 4.4 mmol/L (ref 3.5–5.3)
Sodium: 142 mmol/L (ref 135–146)
TOTAL PROTEIN: 7 g/dL (ref 6.1–8.1)
Total Bilirubin: 0.5 mg/dL (ref 0.2–1.2)

## 2018-05-05 LAB — HEMOGLOBIN A1C
Hgb A1c MFr Bld: 5.9 % of total Hgb — ABNORMAL HIGH (ref <5.7)
Mean Plasma Glucose: 123 (calc)
eAG (mmol/L): 6.8 (calc)

## 2018-05-05 LAB — THYROID PANEL WITH TSH
Free Thyroxine Index: 2.9 (ref 1.4–3.8)
T3 UPTAKE: 26 % (ref 22–35)
T4 TOTAL: 11.2 ug/dL (ref 5.1–11.9)
TSH: 1.36 mIU/L

## 2018-05-09 ENCOUNTER — Other Ambulatory Visit: Payer: Self-pay | Admitting: Orthopaedic Surgery

## 2018-05-09 DIAGNOSIS — M25532 Pain in left wrist: Secondary | ICD-10-CM

## 2018-05-18 ENCOUNTER — Ambulatory Visit
Admission: RE | Admit: 2018-05-18 | Discharge: 2018-05-18 | Disposition: A | Discharge status: Home / Self Care | Payer: Managed Care, Other (non HMO) | Source: Ambulatory Visit | Attending: Orthopaedic Surgery | Admitting: Orthopaedic Surgery

## 2018-05-18 DIAGNOSIS — M25532 Pain in left wrist: Secondary | ICD-10-CM | POA: Insufficient documentation

## 2018-06-05 DIAGNOSIS — S63509A Unspecified sprain of unspecified wrist, initial encounter: Secondary | ICD-10-CM | POA: Insufficient documentation

## 2018-06-05 DIAGNOSIS — T849XXA Unspecified complication of internal orthopedic prosthetic device, implant and graft, initial encounter: Secondary | ICD-10-CM | POA: Insufficient documentation

## 2018-07-17 ENCOUNTER — Ambulatory Visit
Admission: RE | Admit: 2018-07-17 | Discharge: 2018-07-17 | Disposition: A | Discharge status: Home / Self Care | Payer: 59 | Source: Ambulatory Visit | Attending: Nurse Practitioner | Admitting: Nurse Practitioner

## 2018-07-17 ENCOUNTER — Ambulatory Visit: Payer: Self-pay

## 2018-07-17 ENCOUNTER — Other Ambulatory Visit: Payer: Self-pay | Admitting: Nurse Practitioner

## 2018-07-17 ENCOUNTER — Ambulatory Visit (INDEPENDENT_AMBULATORY_CARE_PROVIDER_SITE_OTHER): Payer: 59 | Admitting: Nurse Practitioner

## 2018-07-17 ENCOUNTER — Encounter: Payer: Self-pay | Admitting: Nurse Practitioner

## 2018-07-17 VITALS — BP 100/80 | HR 90 | Temp 97.8°F | Resp 16 | Ht 62.0 in | Wt 206.8 lb

## 2018-07-17 DIAGNOSIS — J0101 Acute recurrent maxillary sinusitis: Secondary | ICD-10-CM

## 2018-07-17 DIAGNOSIS — R05 Cough: Secondary | ICD-10-CM | POA: Insufficient documentation

## 2018-07-17 DIAGNOSIS — R059 Cough, unspecified: Secondary | ICD-10-CM

## 2018-07-17 DIAGNOSIS — J0110 Acute frontal sinusitis, unspecified: Secondary | ICD-10-CM

## 2018-07-17 DIAGNOSIS — R0689 Other abnormalities of breathing: Secondary | ICD-10-CM

## 2018-07-17 DIAGNOSIS — H9201 Otalgia, right ear: Secondary | ICD-10-CM

## 2018-07-17 DIAGNOSIS — J3089 Other allergic rhinitis: Secondary | ICD-10-CM

## 2018-07-17 DIAGNOSIS — L308 Other specified dermatitis: Secondary | ICD-10-CM | POA: Diagnosis not present

## 2018-07-17 MED ORDER — AZELASTINE-FLUTICASONE 137-50 MCG/ACT NA SUSP: 2.0000 | Freq: Every day | NASAL | 3 refills | Status: DC

## 2018-07-17 MED ORDER — CRISABOROLE 2 % EX OINT: 1.0000 "application " | TOPICAL_OINTMENT | Freq: Every day | CUTANEOUS | 2 refills | Status: DC

## 2018-07-17 MED ORDER — DOXYCYCLINE HYCLATE 100 MG PO TABS: 100.0000 mg | ORAL_TABLET | Freq: Two times a day (BID) | ORAL | 0 refills | Status: DC

## 2018-07-17 MED ORDER — ALBUTEROL SULFATE (2.5 MG/3ML) 0.083% IN NEBU
2.5000 mg | INHALATION_SOLUTION | Freq: Once | RESPIRATORY_TRACT | Status: AC
  Administered 2018-07-17: 2.5 mg via RESPIRATORY_TRACT

## 2018-07-17 MED ORDER — ALBUTEROL SULFATE (2.5 MG/3ML) 0.083% IN NEBU: 2.5000 mg | INHALATION_SOLUTION | Freq: Four times a day (QID) | RESPIRATORY_TRACT | 1 refills | Status: AC | PRN

## 2018-07-17 MED ORDER — BENZONATATE 100 MG PO CAPS: 200.0000 mg | ORAL_CAPSULE | Freq: Two times a day (BID) | ORAL | 0 refills | Status: DC | PRN

## 2018-07-17 NOTE — Telephone Encounter (Signed)
Pt c/o wheezing at night, crackles and frequent productive cough. Cough is producing white to green phlegm. Pt stated that the severity is mild. Pt stated that she has been using allergy medication and neti pot. Pt having thick clear to green nasal secretions. Pt c/o sinus congestion and pressure and headache. Care advice given and pt verbalized understanding. Pt given appointment for 10:00 with Sharyon CableElizabeth Poulose NP.  Reason for Disposition . [1] MILD difficulty breathing (e.g., minimal/no SOB at rest, SOB with walking, pulse <100) AND [2] NEW-onset or WORSE than normal    Pt is wheezing at night  Answer Assessment - Initial Assessment Questions 1. RESPIRATORY STATUS: "Describe your breathing?" (e.g., wheezing, shortness of breath, unable to speak, severe coughing)      Wheezing at night, coughing but not severe 2. ONSET: "When did this breathing problem begin?"      2 days ago 3. PATTERN "Does the difficult breathing come and go, or has it been constant since it started?"      Comes and goes and notices more at night 4. SEVERITY: "How bad is your breathing?" (e.g., mild, moderate, severe)    - MILD: No SOB at rest, mild SOB with walking, speaks normally in sentences, can lay down, no retractions, pulse < 100.    - MODERATE: SOB at rest, SOB with minimal exertion and prefers to sit, cannot lie down flat, speaks in phrases, mild retractions, audible wheezing, pulse 100-120.    - SEVERE: Very SOB at rest, speaks in single words, struggling to breathe, sitting hunched forward, retractions, pulse > 120      mild 5. RECURRENT SYMPTOM: "Have you had difficulty breathing before?" If so, ask: "When was the last time?" and "What happened that time?"      When had pna few years ago 6. CARDIAC HISTORY: "Do you have any history of heart disease?" (e.g., heart attack, angina, bypass surgery, angioplasty)      No h/o leaky valve during pregnancy 7. LUNG HISTORY: "Do you have any history of lung disease?"   (e.g., pulmonary embolus, asthma, emphysema)     no 8. CAUSE: "What do you think is causing the breathing problem?"      Allergies or cold that turned into chest congestion 9. OTHER SYMPTOMS: "Do you have any other symptoms? (e.g., dizziness, runny nose, cough, chest pain, fever)     Thick sinus clear to green, thick, sinus problems, headache 10. PREGNANCY: "Is there any chance you are pregnant?" "When was your last menstrual period?"       No LMP: on menses right now 11. TRAVEL: "Have you traveled out of the country in the last month?" (e.g., travel history, exposures)       no  Protocols used: BREATHING DIFFICULTY-A-AH

## 2018-07-17 NOTE — Patient Instructions (Signed)
-   Please get xray completed today - Cough medicine as needed - Nasal spray daily  - Take Singulair and daily anti-histamine - Rest, drink plenty of water.  - Will hear a call from us within 24 hours about xray and If antibiotic will be sent in - Take albuterol neb as needed for wheezing

## 2018-07-17 NOTE — Telephone Encounter (Signed)
Please review

## 2018-07-17 NOTE — Progress Notes (Signed)
Name: Cynthia Villa   MRN: [id number]    DOB: 1981-01-17   Date:07/17/2018       Progress Note  Subjective  Chief Complaint  Chief Complaint  Patient presents with  . Wheezing  . Cough  . Headache  . Sinusitis    HPI 5 days ago started with lip swelling and worsening eczema. Thought the swelling was related to new chapstick.- lips swelling decreased. Eczema on right hand pinky finger severe- dry and scaly.   2-3 days ago started getting cough and sinus pressure- maxillary- started really quickly and is throbbing. Had fever yesterday. States getting lots of nasal exudate out. Mildly productive cough- crackly, wheezing at night- fine during the day- coughing a lot more first thing in the morning.   Patient has been taking nasal spray, neti pot, steamed eucalyptus. Takes turmeric.   Mild nausea, no sore throat, ear pain, vision changes, eye discharge.    Patient Active Problem List   Diagnosis Date Noted  . History of gestational diabetes 11/01/2017  . Migraine headache without aura 08/30/2017  . 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 Urticaria 09/15/2016  . 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 History of thyroid nodule 03/04/2015  . Allergic contact dermatitis 03/03/2015  . Allergic state 03/03/2015  . Allergic to food 03/03/2015  . History of hyperthyroidism 06/17/2011  . Subclinical hypothyroidism 06/17/2011    Past Medical History:  Diagnosis Date  . Allergic eczema   . Allergy   . Exposure to TB     Past Surgical History:  Procedure Laterality Date  . WRIST FRACTURE SURGERY Left 09/08/09   Repaired- August 2004    Social History   Tobacco Use  . Smoking status: Never Smoker  . Smokeless tobacco: Never Used  Substance Use Topics  . Alcohol use: No    Alcohol/week: 0.0 standard drinks     Current Outpatient Medications:  .  Azelastine-Fluticasone 137-50 MCG/ACT SUSP, Place 2 sprays into the nose daily., Disp: 23 g, Rfl: 3 .  ferrous sulfate 325 (65 FE) MG  tablet, Take 1 tablet (325 mg total) by mouth 3 (three) times daily with meals., Disp: 90 tablet, Rfl: 3 .  levocetirizine (XYZAL) 5 MG tablet, Take 1 tablet (5 mg total) by mouth daily., Disp: 30 tablet, Rfl: 3 .  montelukast (SINGULAIR) 10 MG tablet, TAKE 1 TABLET(10 MG) BY MOUTH AT BEDTIME, Disp: 30 tablet, Rfl: 5 .  PARAGARD INTRAUTERINE COPPER IUD IUD, by Intrauterine route., Disp: , Rfl:  .  pimecrolimus (ELIDEL) 1 % cream, Apply topically 2 (two) times daily., Disp: 100 g, Rfl: 3 .  triamcinolone cream (KENALOG) 0.1 %, Apply 1 application topically 2 (two) times daily., Disp: 80 g, Rfl: 2 .  Crisaborole (EUCRISA) 2 % OINT, Apply 1 application topically daily., Disp: 1 Tube, Rfl: 2  Current Facility-Administered Medications:  .  albuterol (PROVENTIL) (2.5 MG/3ML) 0.083% nebulizer solution 2.5 mg, 2.5 mg, Nebulization, Once, Laveyah Oriol, Percell Belt, NP  Allergies  Allergen Reactions  . Cetirizine Other (See Comments)    Other Reaction: CNS Disorder  . Dog Epithelium   . Monistat  [Miconazole] Nausea And Vomiting  . Orange Fruit [Citrus] Swelling  . Penicillins Hives  . Permethrin     rash  . Sudafed  [Pseudoephedrine]     Other reaction(s): Muscle Pain  . Banana Rash  . Pineapple Rash    ROS   No other specific complaints in a complete review of systems (except as listed in HPI above).  Objective  Vitals:   07/17/18 1009 07/17/18 1010  BP: 100/80   Pulse: (!) 105 90  Resp: 16   Temp: 97.8 F (36.6 C)   TempSrc: Oral   SpO2: 99%   Weight: 206 lb 12.8 oz (93.8 kg)   Height: 5\' 2"  (1.575 m)     Body mass index is 37.82 kg/m.  Nursing Note and Vital Signs reviewed.  Physical Exam  Constitutional: She is oriented to person, place, and time. She appears well-developed and well-nourished.  HENT:  Head: Normocephalic and atraumatic.  Right Ear: External ear normal.  Left Ear: External ear normal.  Nose: Nose normal.  Mouth/Throat: Oropharynx is clear and moist. No  oropharyngeal exudate.  Eyes: Pupils are equal, round, and reactive to light. Conjunctivae and EOM are normal. Right eye exhibits no discharge. Left eye exhibits no discharge.  Neck: Normal range of motion. No JVD present. No neck rigidity.  Cardiovascular: Normal rate and regular rhythm.  Pulmonary/Chest: Effort normal. No accessory muscle usage. No tachypnea. She has no decreased breath sounds. She has wheezes. She has rhonchi in the right upper field and the left upper field. She exhibits no tenderness.  Lymphadenopathy:    She has cervical adenopathy (tenderness).  Neurological: She is alert and oriented to person, place, and time.  Skin: Skin is warm and dry. No rash noted.     Psychiatric: She has a normal mood and affect. Her behavior is normal. Judgment normal.      No results found for this or any previous visit (from the past 48 hour(s)).  Assessment & Plan  1. Adventitious breath sounds Patient has wheezing and rhonchi bilaterally throughout lung lobes, after albuterol treatment wheezing decreased but other adventitious breath sounds still noted more pronounced in right lower lobe - albuterol (PROVENTIL) (2.5 MG/3ML) 0.083% nebulizer solution 2.5 mg - DG Chest 2 View; Future - albuterol (PROVENTIL) (2.5 MG/3ML) 0.083% nebulizer solution; Take 3 mLs (2.5 mg total) by nebulization every 6 (six) hours as needed for wheezing or shortness of breath.  Dispense: 150 mL; Refill: 1 - DME Nebulizer machine  2. Acute recurrent maxillary sinusitis Will chose antibiotic pending chest xray due to severity of symptoms.   3. Other eczema - Crisaborole (EUCRISA) 2 % OINT; Apply 1 application topically daily.  Dispense: 1 Tube; Refill: 2  4. Cough - benzonatate (TESSALON PERLES) 100 MG capsule; Take 2 capsules (200 mg total) by mouth 2 (two) times daily as needed for cough.  Dispense: 30 capsule; Refill: 0 - DG Chest 2 View; Future  5. Otalgia, right - Azelastine-Fluticasone 137-50  MCG/ACT SUSP; Place 2 sprays into the nose daily.  Dispense: 23 g; Refill: 3  6. Non-seasonal allergic rhinitis, unspecified trigger - Azelastine-Fluticasone 137-50 MCG/ACT SUSP; Place 2 sprays into the nose daily.  Dispense: 23 g; Refill: 3  -Red flags and when to present for emergency care or RTC including fever >101.10F, chest pain, shortness of breath, new/worsening/un-resolving symptoms,  reviewed with patient at time of visit. Follow up and care instructions discussed and provided in AVS.  Plan sending patient antibiotics pending chest x-ray results, will complete today.

## 2018-07-20 ENCOUNTER — Ambulatory Visit (INDEPENDENT_AMBULATORY_CARE_PROVIDER_SITE_OTHER): Payer: 59 | Admitting: Nurse Practitioner

## 2018-07-20 ENCOUNTER — Encounter: Payer: Self-pay | Admitting: Nurse Practitioner

## 2018-07-20 VITALS — BP 100/70 | HR 101 | Temp 98.6°F | Resp 16 | Ht 62.0 in | Wt 206.4 lb

## 2018-07-20 DIAGNOSIS — J019 Acute sinusitis, unspecified: Secondary | ICD-10-CM

## 2018-07-20 DIAGNOSIS — R05 Cough: Secondary | ICD-10-CM

## 2018-07-20 DIAGNOSIS — R062 Wheezing: Secondary | ICD-10-CM | POA: Diagnosis not present

## 2018-07-20 DIAGNOSIS — R059 Cough, unspecified: Secondary | ICD-10-CM

## 2018-07-20 DIAGNOSIS — R000 Tachycardia, unspecified: Secondary | ICD-10-CM | POA: Diagnosis not present

## 2018-07-20 NOTE — Progress Notes (Signed)
Name: Cynthia Villa   MRN: [id number]    DOB: October 12, 1980   Date:07/20/2018       Progress Note  Subjective  Chief Complaint  Chief Complaint  Patient presents with  . Wheezing    continues to have symptoms  . Cough  . Headache    HPI  States started taking antibiotic and OTC cough medicine, intiallly was not noticing improvement but today coughed up a lot of sputum and has felt significantly better- and shortness of breath and wheezing has improved. She is taking albuterol neb twice a day. Tessalon perls helped a lot with cough but still coughing a lot. Sinus pressure improved some today .   Patient Active Problem List   Diagnosis Date Noted  . History of gestational diabetes 11/01/2017  . Migraine headache without aura 08/30/2017  . 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 Urticaria 09/15/2016  . 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 History of thyroid nodule 03/04/2015  . Allergic contact dermatitis 03/03/2015  . Allergic state 03/03/2015  . Allergic to food 03/03/2015  . History of hyperthyroidism 06/17/2011  . Subclinical hypothyroidism 06/17/2011    Past Medical History:  Diagnosis Date  . Allergic eczema   . Allergy   . Exposure to TB     Past Surgical History:  Procedure Laterality Date  . WRIST FRACTURE SURGERY Left 09/08/09   Repaired- August 2004    Social History   Tobacco Use  . Smoking status: Never Smoker  . Smokeless tobacco: Never Used  Substance Use Topics  . Alcohol use: No    Alcohol/week: 0.0 standard drinks     Current Outpatient Medications:  .  albuterol (PROVENTIL) (2.5 MG/3ML) 0.083% nebulizer solution, Take 3 mLs (2.5 mg total) by nebulization every 6 (six) hours as needed for wheezing or shortness of breath., Disp: 150 mL, Rfl: 1 .  Azelastine-Fluticasone 137-50 MCG/ACT SUSP, Place 2 sprays into the nose daily., Disp: 23 g, Rfl: 3 .  benzonatate (TESSALON PERLES) 100 MG capsule, Take 2 capsules (200 mg total) by mouth 2 (two) times daily  as needed for cough., Disp: 30 capsule, Rfl: 0 .  Crisaborole (EUCRISA) 2 % OINT, Apply 1 application topically daily., Disp: 1 Tube, Rfl: 2 .  doxycycline (VIBRA-TABS) 100 MG tablet, Take 1 tablet (100 mg total) by mouth 2 (two) times daily., Disp: 20 tablet, Rfl: 0 .  ferrous sulfate 325 (65 FE) MG tablet, Take 1 tablet (325 mg total) by mouth 3 (three) times daily with meals., Disp: 90 tablet, Rfl: 3 .  levocetirizine (XYZAL) 5 MG tablet, Take 1 tablet (5 mg total) by mouth daily., Disp: 30 tablet, Rfl: 3 .  montelukast (SINGULAIR) 10 MG tablet, TAKE 1 TABLET(10 MG) BY MOUTH AT BEDTIME, Disp: 30 tablet, Rfl: 5 .  PARAGARD INTRAUTERINE COPPER IUD IUD, by Intrauterine route., Disp: , Rfl:  .  pimecrolimus (ELIDEL) 1 % cream, Apply topically 2 (two) times daily., Disp: 100 g, Rfl: 3 .  triamcinolone cream (KENALOG) 0.1 %, Apply 1 application topically 2 (two) times daily., Disp: 80 g, Rfl: 2  Allergies  Allergen Reactions  . Cetirizine Other (See Comments)    Other Reaction: CNS Disorder  . Dog Epithelium   . Monistat  [Miconazole] Nausea And Vomiting  . Orange Fruit [Citrus] Swelling  . Penicillins Hives  . Permethrin     rash  . Sudafed  [Pseudoephedrine]     Other reaction(s): Muscle Pain  . Banana Rash  . Pineapple Rash  ROS   No other specific complaints in a complete review of systems (except as listed in HPI above).  Objective  Vitals:   07/20/18 1139  BP: 100/70  Pulse: (!) 101  Resp: 16  Temp: 98.6 F (37 C)  TempSrc: Oral  SpO2: 99%  Weight: 206 lb 6.4 oz (93.6 kg)  Height: 5\' 2"  (1.575 m)    Body mass index is 37.75 kg/m.  Nursing Note and Vital Signs reviewed.  Physical Exam  Constitutional: She is oriented to person, place, and time. She appears well-developed and well-nourished.  HENT:  Head: Normocephalic and atraumatic.  Mouth/Throat: Oropharynx is clear and moist.  Cardiovascular: Normal heart sounds and intact distal pulses.  Mildly  elevated rate   Pulmonary/Chest: Effort normal. No stridor. She has wheezes (bilaterally throughout). She has no rales. She exhibits no tenderness.  Neurological: She is alert and oriented to person, place, and time.  Skin: Skin is warm and dry. She is not diaphoretic.  Psychiatric: She has a normal mood and affect. Her behavior is normal.       No results found for this or any previous visit (from the past 48 hour(s)).  Assessment & Plan  1. Cough Add musinex, if not improving call us or go to ER   2. Acute sinusitis, recurrence not specified, unspecified location Continue antibiotics, improving   3. Wheezing Much improved but still present; discussed if not consistently improving will send steroid taper  4. Tachycardia States is on period and take iron supplementation during periods but forgot; will restart, not taking decongestants, will increase hydration; discussed s&s of worsening infection and when to seek ER care

## 2018-07-20 NOTE — Patient Instructions (Addendum)
-   guaifenesin 600mg  twice a day  - Contact us if unimproved

## 2018-07-23 ENCOUNTER — Ambulatory Visit: Payer: Self-pay | Admitting: *Deleted

## 2018-07-23 NOTE — Telephone Encounter (Signed)
Patient has already seen twice for chest congestion- she reports she is still wheezing. She thought it would be better- but it is not- she rested all weekend.  Patient states she was offered steriods at last visit- but declined- she thinks she needs them and would like to go ahead and take them. Patient uses [facility]/ [address].  Reason for Disposition . Caller requesting a NON-URGENT new prescription or refill and triager unable to refill per unit policy  Answer Assessment - Initial Assessment Questions 1. RESPIRATORY STATUS: "Describe your breathing?" (e.g., wheezing, shortness of breath, unable to speak, severe coughing)      Breathing is some better- but the wheezing is still there 2. ONSET: "When did this breathing problem begin?"       Over 1 week 3. PATTERN "Does the difficult breathing come and go, or has it been constant since it started?"      Constant- but worse at night 4. SEVERITY: "How bad is your breathing?" (e.g., mild, moderate, severe)    - MILD: No SOB at rest, mild SOB with walking, speaks normally in sentences, can lay down, no retractions, pulse < 100.    - MODERATE: SOB at rest, SOB with minimal exertion and prefers to sit, cannot lie down flat, speaks in phrases, mild retractions, audible wheezing, pulse 100-120.    - SEVERE: Very SOB at rest, speaks in single words, struggling to breathe, sitting hunched forward, retractions, pulse > 120      Patient states her husband can hear it at night 5. RECURRENT SYMPTOM: "Have you had difficulty breathing before?" If so, ask: "When was the last time?" and "What happened that time?"      Yes- patient was seen twice recently in the office 6. CARDIAC HISTORY: "Do you have any history of heart disease?" (e.g., heart attack, angina, bypass surgery, angioplasty)      no 7. LUNG HISTORY: "Do you have any history of lung disease?"  (e.g., pulmonary embolus, asthma, emphysema)     no 8. CAUSE: "What do you think is causing the  breathing problem?"      Chest congestion 9. OTHER SYMPTOMS: "Do you have any other symptoms? (e.g., dizziness, runny nose, cough, chest pain, fever)     cough 10. PREGNANCY: "Is there any chance you are pregnant?" "When was your last menstrual period?"       No- LMP- 07/16/18 11. TRAVEL: "Have you traveled out of the country in the last month?" (e.g., travel history, exposures)       no  Answer Assessment - Initial Assessment Questions 1. SYMPTOMS: "Do you have any symptoms?"     Cough/wheezing 2. SEVERITY: If symptoms are present, ask "Are they mild, moderate or severe?"     Moderate- patient is requesting medication  Protocols used: MEDICATION QUESTION CALL-A-AH, BREATHING DIFFICULTY-A-AH

## 2018-07-24 ENCOUNTER — Other Ambulatory Visit: Payer: Self-pay | Admitting: Nurse Practitioner

## 2018-07-24 MED ORDER — PREDNISONE 10 MG (48) PO TBPK: ORAL_TABLET | ORAL | 0 refills | Status: DC

## 2018-07-24 NOTE — Telephone Encounter (Signed)
Patient was informed of Elizabeth's message and said thank you.

## 2018-07-24 NOTE — Telephone Encounter (Signed)
Prednisone taper sent to [facility], please take as directed. Eat healthy foods, drink plenty of water, call if unimproved, if worsening please get emergency attention.

## 2018-08-16 ENCOUNTER — Encounter: Payer: Self-pay | Admitting: Family Medicine

## 2018-08-16 ENCOUNTER — Ambulatory Visit (INDEPENDENT_AMBULATORY_CARE_PROVIDER_SITE_OTHER): Payer: Self-pay | Admitting: Family Medicine

## 2018-08-16 VITALS — BP 140/90 | HR 108 | Temp 98.1°F | Resp 16 | Ht 62.0 in | Wt 212.3 lb

## 2018-08-16 DIAGNOSIS — R059 Cough, unspecified: Secondary | ICD-10-CM

## 2018-08-16 DIAGNOSIS — J0101 Acute recurrent maxillary sinusitis: Secondary | ICD-10-CM

## 2018-08-16 DIAGNOSIS — R05 Cough: Secondary | ICD-10-CM

## 2018-08-16 MED ORDER — BENZONATATE 100 MG PO CAPS: 200.0000 mg | ORAL_CAPSULE | Freq: Two times a day (BID) | ORAL | 0 refills | Status: DC | PRN

## 2018-08-16 MED ORDER — LEVOFLOXACIN 500 MG PO TABS: 500.0000 mg | ORAL_TABLET | Freq: Every day | ORAL | 0 refills | Status: DC

## 2018-08-16 NOTE — Progress Notes (Signed)
Name: Cynthia Villa   MRN: [id number]    DOB: 1981-06-24   Date:08/16/2018       Progress Note  Subjective  Chief Complaint  Chief Complaint  Patient presents with  . Sinusitis    x 1 month. She has constant pressure in her sinuses that is worse in the morning.    HPI  PT presents with concern for sinus congestion and maxillary sinus pain/upper dental pain, sore throat, and fevers.  100.53F last night, and 99.53F today at home. No body aches, chills, chest pain, shortness of breath.  However, 3 nights ago she had episodes of coughing that caused her to need the albuterol.  She was treated for sinusitis earlier in December 2019 - was treated with doxycycline and prednisone.  She states she did improve slightly, but never recovered completely.  She is PCN allergic - she had history at age 89 of severe hives reaction to amoxicillin.    Rash: She had rash to right hand that has since resolved with [place].  She now has rash to bilateral antecubital fossas and under bilateral breasts.  The rash is quite itchy.  Has not tried the [place] or triamcinolone.  Patient Active Problem List   Diagnosis Date Noted  . History of gestational diabetes 11/01/2017  . Migraine headache without aura 08/30/2017  . 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 Urticaria 09/15/2016  . 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 History of thyroid nodule 03/04/2015  . Allergic contact dermatitis 03/03/2015  . Allergic state 03/03/2015  . Allergic to food 03/03/2015  . History of hyperthyroidism 06/17/2011  . Subclinical hypothyroidism 06/17/2011    Social History   Tobacco Use  . Smoking status: Never Smoker  . Smokeless tobacco: Never Used  Substance Use Topics  . Alcohol use: No    Alcohol/week: 0.0 standard drinks     Current Outpatient Medications:  .  albuterol (PROVENTIL) (2.5 MG/3ML) 0.083% nebulizer solution, Take 3 mLs (2.5 mg total) by nebulization every 6 (six) hours as needed for wheezing or shortness  of breath., Disp: 150 mL, Rfl: 1 .  Azelastine-Fluticasone 137-50 MCG/ACT SUSP, Place 2 sprays into the nose daily., Disp: 23 g, Rfl: 3 .  benzonatate (TESSALON PERLES) 100 MG capsule, Take 2 capsules (200 mg total) by mouth 2 (two) times daily as needed for cough., Disp: 30 capsule, Rfl: 0 .  Crisaborole (EUCRISA) 2 % OINT, Apply 1 application topically daily., Disp: 1 Tube, Rfl: 2 .  doxycycline (VIBRA-TABS) 100 MG tablet, Take 1 tablet (100 mg total) by mouth 2 (two) times daily., Disp: 20 tablet, Rfl: 0 .  ferrous sulfate 325 (65 FE) MG tablet, Take 1 tablet (325 mg total) by mouth 3 (three) times daily with meals., Disp: 90 tablet, Rfl: 3 .  levocetirizine (XYZAL) 5 MG tablet, Take 1 tablet (5 mg total) by mouth daily., Disp: 30 tablet, Rfl: 3 .  montelukast (SINGULAIR) 10 MG tablet, TAKE 1 TABLET(10 MG) BY MOUTH AT BEDTIME, Disp: 30 tablet, Rfl: 5 .  PARAGARD INTRAUTERINE COPPER IUD IUD, by Intrauterine route., Disp: , Rfl:  .  pimecrolimus (ELIDEL) 1 % cream, Apply topically 2 (two) times daily., Disp: 100 g, Rfl: 3 .  predniSONE (STERAPRED UNI-PAK 48 TAB) 10 MG (48) TBPK tablet, Take as directed, Disp: 48 tablet, Rfl: 0 .  triamcinolone cream (KENALOG) 0.1 %, Apply 1 application topically 2 (two) times daily., Disp: 80 g, Rfl: 2  Allergies  Allergen Reactions  . Cetirizine Other (See  Comments)    Other Reaction: CNS Disorder  . Dog Epithelium   . Monistat  [Miconazole] Nausea And Vomiting  . Orange Fruit [Citrus] Swelling  . Penicillins Hives  . Permethrin     rash  . Sudafed  [Pseudoephedrine]     Other reaction(s): Muscle Pain  . Banana Rash  . Pineapple Rash    I personally reviewed active problem list, medication list, allergies, notes from last encounter, lab results with the patient/caregiver today.  ROS  Ten systems reviewed and is negative except as mentioned in HPI  Objective  Vitals:   08/16/18 1321  BP: 140/90  Pulse: (!) 108  Resp: 16  Temp: 98.1 F (36.7  C)  TempSrc: Oral  SpO2: 98%  Weight: 212 lb 4.8 oz (96.3 kg)  Height: 5\' 2"  (1.575 m)   Body mass index is 38.83 kg/m.  Nursing Note and Vital Signs reviewed.  Physical Exam  Constitutional: Patient appears well-developed and well-nourished. Obese. No distress.  HEENT: head atraumatic, normocephalic, pupils equal and reactive to light, Bilateral TM's without erythema or effusion,  bilateral maxillary sinuses are tender; frontal sinuses are non-tender, neck supple with mild submandibular lymphadenopathy, throat within normal limits - no erythema or exudate, no tonsillar swelling Cardiovascular: Normal rate, regular rhythm and normal heart sounds.  No murmur heard. No BLE edema. Pulmonary/Chest: Effort normal and breath sounds clear bilaterally. No respiratory distress. Psychiatric: Patient has a normal mood and affect. behavior is normal. Judgment and thought content normal.  No results found for this or any previous visit (from the past 72 hour(s)).  Assessment & Plan  1. Acute recurrent maxillary sinusitis - Discussed guidelines at length with the patient, weighed risk/benefit of fluoroquinolone therapy, and she verbalizes understanding.  She is PCN allergic and did not improve with doxycycline, so we will trial 5 day course of levaquin while she awaits ENT referral. - levofloxacin (LEVAQUIN) 500 MG tablet; Take 1 tablet (500 mg total) by mouth daily.  Dispense: 5 tablet; Refill: 0 - Ambulatory referral to ENT  2. Cough - Tessalon per orders. - Ambulatory referral to ENT  -Red flags and when to present for emergency care or RTC including fever >101.11F, chest pain, shortness of breath, new/worsening/un-resolving symptoms, reviewed with patient at time of visit. Follow up and care instructions discussed and provided in AVS.

## 2018-08-17 ENCOUNTER — Other Ambulatory Visit: Payer: Self-pay | Admitting: Family Medicine

## 2018-08-17 DIAGNOSIS — L509 Urticaria, unspecified: Secondary | ICD-10-CM

## 2018-08-17 DIAGNOSIS — L239 Allergic contact dermatitis, unspecified cause: Secondary | ICD-10-CM

## 2018-08-17 MED ORDER — MONTELUKAST SODIUM 10 MG PO TABS: 10.0000 mg | ORAL_TABLET | Freq: Every day | ORAL | 1 refills | Status: DC

## 2018-08-17 NOTE — Telephone Encounter (Signed)
Copied from CRM [id number]. Topic: Quick Communication - Rx Refill/Question >> Aug 17, 2018 11:32 AM Jens Som A wrote: Medication: montelukast (SINGULAIR) 10 MG tablet [[id number]]   Has the patient contacted their pharmacy? Yes  (Agent: If no, request that the patient contact the pharmacy for the refill.) (Agent: If yes, when and what did the pharmacy advise?)  Preferred Pharmacy (with phone number or street name): [id number] [place], [place] - [address] AT [facility] [address] [phone] (Phone) [phone] (Fax)    Agent: Please be advised that RX refills may take up to 3 business days. We ask that you follow-up with your pharmacy.

## 2018-11-05 ENCOUNTER — Encounter: Payer: Managed Care, Other (non HMO) | Admitting: Family Medicine

## 2018-11-30 ENCOUNTER — Other Ambulatory Visit: Payer: Self-pay | Admitting: Nurse Practitioner

## 2018-11-30 DIAGNOSIS — J3089 Other allergic rhinitis: Secondary | ICD-10-CM

## 2018-11-30 DIAGNOSIS — H9201 Otalgia, right ear: Secondary | ICD-10-CM

## 2020-03-11 ENCOUNTER — Ambulatory Visit
Admission: EM | Admit: 2020-03-11 | Discharge: 2020-03-11 | Disposition: A | Discharge status: Home / Self Care | Payer: 59 | Attending: Family Medicine | Admitting: Family Medicine

## 2020-03-11 ENCOUNTER — Other Ambulatory Visit: Payer: Self-pay

## 2020-03-11 ENCOUNTER — Telehealth: Payer: Self-pay | Admitting: Infectious Diseases

## 2020-03-11 DIAGNOSIS — R509 Fever, unspecified: Secondary | ICD-10-CM

## 2020-03-11 DIAGNOSIS — U071 COVID-19: Secondary | ICD-10-CM

## 2020-03-11 DIAGNOSIS — J3489 Other specified disorders of nose and nasal sinuses: Secondary | ICD-10-CM | POA: Diagnosis not present

## 2020-03-11 DIAGNOSIS — R000 Tachycardia, unspecified: Secondary | ICD-10-CM | POA: Diagnosis not present

## 2020-03-11 DIAGNOSIS — Z20822 Contact with and (suspected) exposure to covid-19: Secondary | ICD-10-CM

## 2020-03-11 LAB — POC SARS CORONAVIRUS 2 AG -  ED: SARS Coronavirus 2 Ag: POSITIVE — AB

## 2020-03-11 NOTE — Telephone Encounter (Signed)
In speaking with her husband about monoclonal Ab therapy I learned that she is feeling symptomatic as well with fevers 102 despite tylenol, nasal congestion, cough and fatigue/chills/bodyaches.  She has not been tested yet but meets criteria based on BMI for monoclonal therapy with BMI 38. Her symptoms started Monday 7/26  I asked her to please go get tested and sent her information about [facility] treatment. She will call back or send a mychart message if she would like to receive infusion at [facility].    Rexene Alberts, MSN, NP-C [facility]  [email or web address] Pager: [phone] Office: [phone] [facility] Main Line: [phone]

## 2020-03-11 NOTE — ED Triage Notes (Signed)
Pt presents to UC for fever x2 days. Pt her husband is covid positive. Pt requesting testing. Pt endorsing, runny nose, and cough from post nasal drip. Pt also endorsing chills and sweating at home.   Pt has been treating with tylenol.

## 2020-03-11 NOTE — Discharge Instructions (Signed)
Your COVID test is pending.  You should self quarantine until the test result is back.    Take Tylenol as needed for fever or discomfort.  Rest and keep yourself hydrated.    Go to the emergency department if you develop acute worsening symptoms.     

## 2020-03-11 NOTE — ED Provider Notes (Signed)
[place] [facility]   [id number] 03/11/20 Arrival Time: 1194   CC: COVID symptoms  SUBJECTIVE: History from: patient.  Tichina Koebel is a 39 y.o. female who presents with abrupt onset of fever and runny nose for the last 3 days. Reports mild fever relief with tylenol. Reports that her husband has Covid. Denies recent travel. Has not taken OTC medications for this. There are no aggravating or alleviating symptoms. Denies history of Covid. Has not had Covid vaccines. Denies previous symptoms in the past. Denies sinus pain, sore throat, SOB, wheezing, chest pain, nausea, changes in bowel or bladder habits.    ROS: As per HPI.  All other pertinent ROS negative.     Past Medical History:  Diagnosis Date   Allergic eczema    Allergy    Exposure to TB    Past Surgical History:  Procedure Laterality Date   WRIST FRACTURE SURGERY Left 09/08/09   Repaired- August 2004   Allergies  Allergen Reactions   Cetirizine Other (See Comments)    Other Reaction: CNS Disorder   Dog Epithelium    Monistat  [Miconazole] Nausea And Vomiting   Orange Fruit [Citrus] Swelling   Penicillins Hives   Permethrin     rash   Sudafed  [Pseudoephedrine]     Other reaction(s): Muscle Pain   Banana Rash   Pineapple Rash   No current facility-administered medications on file prior to encounter.   Current Outpatient Medications on File Prior to Encounter  Medication Sig Dispense Refill   Azelastine-Fluticasone 137-50 MCG/ACT SUSP SHAKE LIQUID AND USE 2 SPRAYS IN EACH NOSTRIL DAILY 23 g 3   albuterol (PROVENTIL) (2.5 MG/3ML) 0.083% nebulizer solution Take 3 mLs (2.5 mg total) by nebulization every 6 (six) hours as needed for wheezing or shortness of breath. 150 mL 1   benzonatate (TESSALON PERLES) 100 MG capsule Take 2 capsules (200 mg total) by mouth 2 (two) times daily as needed for cough. 30 capsule 0   Crisaborole (EUCRISA) 2 % OINT Apply 1 application topically daily. 1 Tube 2   ferrous  sulfate 325 (65 FE) MG tablet Take 1 tablet (325 mg total) by mouth 3 (three) times daily with meals. 90 tablet 3   levocetirizine (XYZAL) 5 MG tablet Take 1 tablet (5 mg total) by mouth daily. 30 tablet 3   levofloxacin (LEVAQUIN) 500 MG tablet Take 1 tablet (500 mg total) by mouth daily. 5 tablet 0   montelukast (SINGULAIR) 10 MG tablet Take 1 tablet (10 mg total) by mouth at bedtime. 90 tablet 1   PARAGARD INTRAUTERINE COPPER IUD IUD by Intrauterine route.     pimecrolimus (ELIDEL) 1 % cream Apply topically 2 (two) times daily. 100 g 3   triamcinolone cream (KENALOG) 0.1 % Apply 1 application topically 2 (two) times daily. 80 g 2   Social History   Socioeconomic History   Marital status: Married    Spouse name: Montine Circle   Number of children: 5   Years of education: Not on file   Highest education level: Bachelor's degree (e.g., BA, AB, BS)  Occupational History   Not on file  Tobacco Use   Smoking status: Never Smoker   Smokeless tobacco: Never Used  Vaping Use   Vaping Use: Never used  Substance and Sexual Activity   Alcohol use: No    Alcohol/week: 0.0 standard drinks   Drug use: No   Sexual activity: Yes    Partners: Male    Birth control/protection: I.U.D.  Other Topics  Concern   Not on file  Social History Narrative   She is married, 3 biological children and 2 adopted children    Social Determinants of Radio broadcast assistant Strain:    Difficulty of Paying Living Expenses:   Food Insecurity:    Worried About Charity fundraiser in the Last Year:    Arboriculturist in the Last Year:   Transportation Needs:    Film/video editor (Medical):    Lack of Transportation (Non-Medical):   Physical Activity:    Days of Exercise per Week:    Minutes of Exercise per Session:   Stress:    Feeling of Stress :   Social Connections:    Frequency of Communication with Friends and Family:    Frequency of Social Gatherings with Friends and  Family:    Attends Religious Services:    Active Member of Clubs or Organizations:    Attends Music therapist:    Marital Status:   Intimate Partner Violence:    Fear of Current or Ex-Partner:    Emotionally Abused:    Physically Abused:    Sexually Abused:    Family History  Problem Relation Age of Onset   Diabetes Mother        DM-2nd to chronic pancreatitis   Alcohol abuse Mother    Cancer Mother        Breast   Cholelithiasis Mother    Lung cancer Mother        Tobacco smoker   COPD Mother    Sleep apnea Father    Hypertension Father    PKU Son    Polycystic ovary syndrome Sister    Cholelithiasis Sister    COPD Maternal Grandmother    Emphysema Maternal Grandmother        Tobacco smoker   Prostate cancer Paternal Grandfather    Breast cancer Cousin    Cancer Paternal Aunt     OBJECTIVE:  Vitals:   03/11/20 1724 03/11/20 1732  BP:  (!) 112/62  Pulse: (!) 117   Resp: 16   Temp: (!) 100.9 F (38.3 C)   TempSrc: Oral   SpO2: 98%      General appearance: alert; appears fatigued, but nontoxic; speaking in full sentences and tolerating own secretions HEENT: NCAT; Ears: EACs clear, TMs pearly gray; Eyes: PERRL.  EOM grossly intact. Sinuses: nontender; Nose: nares patent with rhinorrhea, Throat: oropharynx clear, tonsils non erythematous or enlarged, uvula midline  Neck: supple without LAD Lungs: unlabored respirations, symmetrical air entry; cough: absent; no respiratory distress; CTAB Heart: regular rate and rhythm.  Radial pulses 2+ symmetrical bilaterally Skin: warm and dry Psychological: alert and cooperative; normal mood and affect  LABS:  Results for orders placed or performed during the hospital encounter of 03/11/20 (from the past 24 hour(s))  POC SARS Coronavirus 2 Ag-ED - Nasal Swab (BD Veritor Kit)     Status: Abnormal   Collection Time: 03/11/20  5:35 PM  Result Value Ref Range   SARS Coronavirus 2 Ag  Positive (A) Negative     ASSESSMENT & PLAN:  1. COVID-19   2. Fever, unspecified fever cause   3. Rhinorrhea   4. Tachycardia   5. Exposure to COVID-19 virus     No orders of the defined types were placed in this encounter.   Rapid Covid test was positive.  Patient should remain in quarantine for 10 days from symptom onset AND greater than 72 hours after  symptoms resolution (absence of fever without the use of fever-reducing medication and improvement in respiratory symptoms), whichever is longer Get plenty of rest and push fluids Use OTC zyrtec for nasal congestion, runny nose, and/or sore throat Use OTC flonase for nasal congestion and runny nose Use medications daily for symptom relief Use OTC medications like ibuprofen or tylenol as needed fever or pain Call or go to the ED if you have any new or worsening symptoms such as fever, worsening cough, shortness of breath, chest tightness, chest pain, turning blue, changes in mental status.  Reviewed expectations re: course of current medical issues. Questions answered. Outlined signs and symptoms indicating need for more acute intervention. Patient verbalized understanding. After Visit Summary given.         Faustino Congress, NP 03/11/20 1801

## 2020-03-12 ENCOUNTER — Telehealth: Payer: Self-pay | Admitting: Physician Assistant

## 2020-03-12 ENCOUNTER — Other Ambulatory Visit: Payer: Self-pay | Admitting: Physician Assistant

## 2020-03-12 DIAGNOSIS — U071 COVID-19: Secondary | ICD-10-CM

## 2020-03-12 NOTE — Telephone Encounter (Signed)
I connected by phone with Cynthia Villa on 03/12/2020 at 5:07 PM to discuss the potential use of a new treatment for mild to moderate COVID-19 viral infection in non-hospitalized patients.  This patient is a 39 y.o. female that meets the FDA criteria for Emergency Use Authorization of COVID monoclonal antibody casirivimab/imdevimab.  Has a (+) direct SARS-CoV-2 viral test result  Has mild or moderate COVID-19   Is NOT hospitalized due to COVID-19  Is within 10 days of symptom onset  Has at least one of the high risk factor(s) for progression to severe COVID-19 and/or hospitalization as defined in EUA.  Specific high risk criteria : BMI > 25   I have spoken and communicated the following to the patient or parent/caregiver regarding COVID monoclonal antibody treatment:  1. FDA has authorized the emergency use for the treatment of mild to moderate COVID-19 in adults and pediatric patients with positive results of direct SARS-CoV-2 viral testing who are 45 years of age and older weighing at least 40 kg, and who are at high risk for progressing to severe COVID-19 and/or hospitalization.  2. The significant known and potential risks and benefits of COVID monoclonal antibody, and the extent to which such potential risks and benefits are unknown.  3. Information on available alternative treatments and the risks and benefits of those alternatives, including clinical trials.  4. Patients treated with COVID monoclonal antibody should continue to self-isolate and use infection control measures (e.g., wear mask, isolate, social distance, avoid sharing personal items, clean and disinfect "high touch" surfaces, and frequent handwashing) according to CDC guidelines.   5. The patient or parent/caregiver has the option to accept or refuse COVID monoclonal antibody treatment.  After reviewing this information with the patient, The patient agreed to proceed with receiving casirivimab\imdevimab infusion and will  be provided a copy of the Fact sheet prior to receiving the infusion.  Sx onset 7/26. Set up for infusion on 7/31 @10 :30am. Directions given to [facility] Pt is aware that insurance will be charged an infusion fee.   [facility] 03/12/2020 5:07 PM

## 2020-03-13 MED ORDER — SODIUM CHLORIDE 0.9 % IV SOLN
Freq: Once | INTRAVENOUS | Status: AC
  Filled 2020-03-13: qty 600

## 2020-03-14 ENCOUNTER — Ambulatory Visit (HOSPITAL_COMMUNITY)
Admission: RE | Admit: 2020-03-14 | Discharge: 2020-03-14 | Disposition: A | Discharge status: Home / Self Care | Payer: 59 | Source: Ambulatory Visit | Attending: Pulmonary Disease | Admitting: Pulmonary Disease

## 2020-03-14 DIAGNOSIS — U071 COVID-19: Secondary | ICD-10-CM | POA: Diagnosis not present

## 2020-03-14 MED ORDER — SODIUM CHLORIDE 0.9 % IV SOLN: INTRAVENOUS | Status: DC | PRN

## 2020-03-14 MED ORDER — DIPHENHYDRAMINE HCL 50 MG/ML IJ SOLN: 50.0000 mg | Freq: Once | INTRAMUSCULAR | Status: DC | PRN

## 2020-03-14 MED ORDER — FAMOTIDINE IN NACL 20-0.9 MG/50ML-% IV SOLN: 20.0000 mg | Freq: Once | INTRAVENOUS | Status: DC | PRN

## 2020-03-14 MED ORDER — EPINEPHRINE 0.3 MG/0.3ML IJ SOAJ: 0.3000 mg | Freq: Once | INTRAMUSCULAR | Status: DC | PRN

## 2020-03-14 MED ORDER — ALBUTEROL SULFATE HFA 108 (90 BASE) MCG/ACT IN AERS: 2.0000 | INHALATION_SPRAY | Freq: Once | RESPIRATORY_TRACT | Status: DC | PRN

## 2020-03-14 MED ORDER — METHYLPREDNISOLONE SODIUM SUCC 125 MG IJ SOLR: 125.0000 mg | Freq: Once | INTRAMUSCULAR | Status: DC | PRN

## 2020-03-14 NOTE — Progress Notes (Signed)
Patient c/o of not feeling well while RN starting PIV, noted to be pale and cold clammy. Patient alert, oriented. V/S within normal range as recorded in flowsheets. Within 5 minutes patient states " she feel better". Continue to monitor.

## 2020-03-14 NOTE — Progress Notes (Signed)
  Diagnosis: COVID-19  Physician: Dr. Wright   Procedure: Covid Infusion Clinic Med: casirivimab\imdevimab infusion - Provided patient with casirivimab\imdevimab fact sheet for patients, parents and caregivers prior to infusion.  Complications: No immediate complications noted.  Discharge: Discharged home   Ellen Mayol  Bell 03/14/2020  

## 2020-03-14 NOTE — Discharge Instructions (Signed)
10 Things You Can Do to Manage Your COVID-19 Symptoms at Home °If you have possible or confirmed COVID-19: °1. Stay home from work and school. And stay away from other public places. If you must go out, avoid using any kind of public transportation, ridesharing, or taxis. °2. Monitor your symptoms carefully. If your symptoms get worse, call your healthcare provider immediately. °3. Get rest and stay hydrated. °4. If you have a medical appointment, call the healthcare provider ahead of time and tell them that you have or may have COVID-19. °5. For medical emergencies, call 911 and notify the dispatch personnel that you have or may have COVID-19. °6. Cover your cough and sneezes with a tissue or use the inside of your elbow. °7. Wash your hands often with soap and water for at least 20 seconds or clean your hands with an alcohol-based hand sanitizer that contains at least 60% alcohol. °8. As much as possible, stay in a specific room and away from other people in your home. Also, you should use a separate bathroom, if available. If you need to be around other people in or outside of the home, wear a mask. °9. Avoid sharing personal items with other people in your household, like dishes, towels, and bedding. °10. Clean all surfaces that are touched often, like counters, tabletops, and doorknobs. Use household cleaning sprays or wipes according to the label instructions. °cdc.gov/coronavirus °02/13/2019 °This information is not intended to replace advice given to you by your health care provider. Make sure you discuss any questions you have with your health care provider. °Document Revised: 07/18/2019 Document Reviewed: 07/18/2019 °Elsevier Patient Education © 2020 [employer] ° ° °COVID-19 °COVID-19 is a respiratory infection that is caused by a virus called severe acute respiratory syndrome coronavirus 2 (SARS-CoV-2). The disease is also known as coronavirus disease or novel coronavirus. In some people, the virus may  not cause any symptoms. In others, it may cause a serious infection. The infection can get worse quickly and can lead to complications, such as: °· Pneumonia, or infection of the lungs. °· Acute respiratory distress syndrome or ARDS. This is a condition in which fluid build-up in the lungs prevents the lungs from filling with air and passing oxygen into the blood. °· Acute respiratory failure. This is a condition in which there is not enough oxygen passing from the lungs to the body or when carbon dioxide is not passing from the lungs out of the body. °· Sepsis or septic shock. This is a serious bodily reaction to an infection. °· Blood clotting problems. °· Secondary infections due to bacteria or fungus. °· Organ failure. This is when your body's organs stop working. °The virus that causes COVID-19 is contagious. This means that it can spread from person to person through droplets from coughs and sneezes (respiratory secretions). °What are the causes? °This illness is caused by a virus. You may catch the virus by: °· Breathing in droplets from an infected person. Droplets can be spread by a person breathing, speaking, singing, coughing, or sneezing. °· Touching something, like a table or a doorknob, that was exposed to the virus (contaminated) and then touching your mouth, nose, or eyes. °What increases the risk? °Risk for infection °You are more likely to be infected with this virus if you: °· Are within 6 feet (2 meters) of a person with COVID-19. °· Provide care for or live with a person who is infected with COVID-19. °· Spend time in crowded indoor spaces or   live in shared housing. °Risk for serious illness °You are more likely to become seriously ill from the virus if you: °· Are 50 years of age or older. The higher your age, the more you are at risk for serious illness. °· Live in a nursing home or long-term care facility. °· Have cancer. °· Have a long-term (chronic) disease such as: °? Chronic lung disease,  including chronic obstructive pulmonary disease or asthma. °? A long-term disease that lowers your body's ability to fight infection (immunocompromised). °? Heart disease, including heart failure, a condition in which the arteries that lead to the heart become narrow or blocked (coronary artery disease), a disease which makes the heart muscle thick, weak, or stiff (cardiomyopathy). °? Diabetes. °? Chronic kidney disease. °? Sickle cell disease, a condition in which red blood cells have an abnormal "sickle" shape. °? Liver disease. °· Are obese. °What are the signs or symptoms? °Symptoms of this condition can range from mild to severe. Symptoms may appear any time from 2 to 14 days after being exposed to the virus. They include: °· A fever or chills. °· A cough. °· Difficulty breathing. °· Headaches, body aches, or muscle aches. °· Runny or stuffy (congested) nose. °· A sore throat. °· New loss of taste or smell. °Some people may also have stomach problems, such as nausea, vomiting, or diarrhea. °Other people may not have any symptoms of COVID-19. °How is this diagnosed? °This condition may be diagnosed based on: °· Your signs and symptoms, especially if: °? You live in an area with a COVID-19 outbreak. °? You recently traveled to or from an area where the virus is common. °? You provide care for or live with a person who was diagnosed with COVID-19. °? You were exposed to a person who was diagnosed with COVID-19. °· A physical exam. °· Lab tests, which may include: °? Taking a sample of fluid from the back of your nose and throat (nasopharyngeal fluid), your nose, or your throat using a swab. °? A sample of mucus from your lungs (sputum). °? Blood tests. °· Imaging tests, which may include, X-rays, CT scan, or ultrasound. °How is this treated? °At present, there is no medicine to treat COVID-19. Medicines that treat other diseases are being used on a trial basis to see if they are effective against COVID-19. °Your  health care provider will talk with you about ways to treat your symptoms. For most people, the infection is mild and can be managed at home with rest, fluids, and over-the-counter medicines. °Treatment for a serious infection usually takes places in a hospital intensive care unit (ICU). It may include one or more of the following treatments. These treatments are given until your symptoms improve. °· Receiving fluids and medicines through an IV. °· Supplemental oxygen. Extra oxygen is given through a tube in the nose, a face mask, or a hood. °· Positioning you to lie on your stomach (prone position). This makes it easier for oxygen to get into the lungs. °· Continuous positive airway pressure (CPAP) or bi-level positive airway pressure (BPAP) machine. This treatment uses mild air pressure to keep the airways open. A tube that is connected to a motor delivers oxygen to the body. °· Ventilator. This treatment moves air into and out of the lungs by using a tube that is placed in your windpipe. °· Tracheostomy. This is a procedure to create a hole in the neck so that a breathing tube can be inserted. °· Extracorporeal membrane   oxygenation (ECMO). This procedure gives the lungs a chance to recover by taking over the functions of the heart and lungs. It supplies oxygen to the body and removes carbon dioxide. °Follow these instructions at home: °Lifestyle °· If you are sick, stay home except to get medical care. Your health care provider will tell you how long to stay home. Call your health care provider before you go for medical care. °· Rest at home as told by your health care provider. °· Do not use any products that contain nicotine or tobacco, such as cigarettes, e-cigarettes, and chewing tobacco. If you need help quitting, ask your health care provider. °· Return to your normal activities as told by your health care provider. Ask your health care provider what activities are safe for you. °General  instructions °· Take over-the-counter and prescription medicines only as told by your health care provider. °· Drink enough fluid to keep your urine pale yellow. °· Keep all follow-up visits as told by your health care provider. This is important. °How is this prevented? ° °There is no vaccine to help prevent COVID-19 infection. However, there are steps you can take to protect yourself and others from this virus. °To protect yourself:  °· Do not travel to areas where COVID-19 is a risk. The areas where COVID-19 is reported change often. To identify high-risk areas and travel restrictions, check the CDC travel website: wwwnc.cdc.gov/travel/notices °· If you live in, or must travel to, an area where COVID-19 is a risk, take precautions to avoid infection. °? Stay away from people who are sick. °? Wash your hands often with soap and water for 20 seconds. If soap and water are not available, use an alcohol-based hand sanitizer. °? Avoid touching your mouth, face, eyes, or nose. °? Avoid going out in public, follow guidance from your state and local health authorities. °? If you must go out in public, wear a cloth face covering or face mask. Make sure your mask covers your nose and mouth. °? Avoid crowded indoor spaces. Stay at least 6 feet (2 meters) away from others. °? Disinfect objects and surfaces that are frequently touched every day. This may include: °§ Counters and tables. °§ Doorknobs and light switches. °§ Sinks and faucets. °§ Electronics, such as phones, remote controls, keyboards, computers, and tablets. °To protect others: °If you have symptoms of COVID-19, take steps to prevent the virus from spreading to others. °· If you think you have a COVID-19 infection, contact your health care provider right away. Tell your health care team that you think you may have a COVID-19 infection. °· Stay home. Leave your house only to seek medical care. Do not use public transport. °· Do not travel while you are  sick. °· Wash your hands often with soap and water for 20 seconds. If soap and water are not available, use alcohol-based hand sanitizer. °· Stay away from other members of your household. Let healthy household members care for children and pets, if possible. If you have to care for children or pets, wash your hands often and wear a mask. If possible, stay in your own room, separate from others. Use a different bathroom. °· Make sure that all people in your household wash their hands well and often. °· Cough or sneeze into a tissue or your sleeve or elbow. Do not cough or sneeze into your hand or into the air. °· Wear a cloth face covering or face mask. Make sure your mask covers your nose   and mouth. °Where to find more information °· Centers for Disease Control and Prevention: www.cdc.gov/coronavirus/2019-ncov/index.html °· World Health Organization: www.who.int/health-topics/coronavirus °Contact a health care provider if: °· You live in or have traveled to an area where COVID-19 is a risk and you have symptoms of the infection. °· You have had contact with someone who has COVID-19 and you have symptoms of the infection. °Get help right away if: °· You have trouble breathing. °· You have pain or pressure in your chest. °· You have confusion. °· You have bluish lips and fingernails. °· You have difficulty waking from sleep. °· You have symptoms that get worse. °These symptoms may represent a serious problem that is an emergency. Do not wait to see if the symptoms will go away. Get medical help right away. Call your local emergency services (911 in the [place] Do not drive yourself to the hospital. Let the emergency medical personnel know if you think you have COVID-19. °Summary °· COVID-19 is a respiratory infection that is caused by a virus. It is also known as coronavirus disease or novel coronavirus. It can cause serious infections, such as pneumonia, acute respiratory distress syndrome, acute respiratory failure,  or sepsis. °· The virus that causes COVID-19 is contagious. This means that it can spread from person to person through droplets from breathing, speaking, singing, coughing, or sneezing. °· You are more likely to develop a serious illness if you are 50 years of age or older, have a weak immune system, live in a nursing home, or have chronic disease. °· There is no medicine to treat COVID-19. Your health care provider will talk with you about ways to treat your symptoms. °· Take steps to protect yourself and others from infection. Wash your hands often and disinfect objects and surfaces that are frequently touched every day. Stay away from people who are sick and wear a mask if you are sick. °This information is not intended to replace advice given to you by your health care provider. Make sure you discuss any questions you have with your health care provider. °Document Revised: 05/31/2019 Document Reviewed: 09/06/2018 °Elsevier Patient Education © 2020 [employer] ° °

## 2020-05-28 ENCOUNTER — Encounter: Payer: Self-pay | Admitting: Family Medicine

## 2020-10-22 ENCOUNTER — Telehealth: Payer: Self-pay

## 2020-10-22 NOTE — Telephone Encounter (Signed)
Copied from CRM [id number]. Topic: General - Inquiry >> Oct 22, 2020  9:58 AM Daphine Deutscher D wrote: Reason for CRM: Pt called saying she is having symptoms of a sinus infection.  She says her insurance will not pay for a virtual appt.  She is asking advise on what to do.    CB#  [phone]

## 2020-10-22 NOTE — Telephone Encounter (Signed)
Lvm to set up appt

## 2020-10-23 ENCOUNTER — Other Ambulatory Visit: Payer: Self-pay

## 2020-10-23 ENCOUNTER — Encounter: Payer: Self-pay | Admitting: Family Medicine

## 2020-10-23 ENCOUNTER — Ambulatory Visit (INDEPENDENT_AMBULATORY_CARE_PROVIDER_SITE_OTHER): Payer: 59 | Admitting: Family Medicine

## 2020-10-23 VITALS — BP 132/74 | HR 100 | Temp 99.0°F | Resp 16 | Ht 62.0 in | Wt 203.9 lb

## 2020-10-23 DIAGNOSIS — Z0000 Encounter for general adult medical examination without abnormal findings: Secondary | ICD-10-CM

## 2020-10-23 DIAGNOSIS — J309 Allergic rhinitis, unspecified: Secondary | ICD-10-CM | POA: Insufficient documentation

## 2020-10-23 DIAGNOSIS — Z13220 Encounter for screening for lipoid disorders: Secondary | ICD-10-CM

## 2020-10-23 DIAGNOSIS — Z8639 Personal history of other endocrine, nutritional and metabolic disease: Secondary | ICD-10-CM

## 2020-10-23 DIAGNOSIS — Z23 Encounter for immunization: Secondary | ICD-10-CM | POA: Diagnosis not present

## 2020-10-23 DIAGNOSIS — R059 Cough, unspecified: Secondary | ICD-10-CM | POA: Insufficient documentation

## 2020-10-23 DIAGNOSIS — J3081 Allergic rhinitis due to animal (cat) (dog) hair and dander: Secondary | ICD-10-CM | POA: Insufficient documentation

## 2020-10-23 DIAGNOSIS — J320 Chronic maxillary sinusitis: Secondary | ICD-10-CM

## 2020-10-23 DIAGNOSIS — J301 Allergic rhinitis due to pollen: Secondary | ICD-10-CM | POA: Insufficient documentation

## 2020-10-23 DIAGNOSIS — N6320 Unspecified lump in the left breast, unspecified quadrant: Secondary | ICD-10-CM

## 2020-10-23 DIAGNOSIS — L209 Atopic dermatitis, unspecified: Secondary | ICD-10-CM | POA: Insufficient documentation

## 2020-10-23 DIAGNOSIS — R7303 Prediabetes: Secondary | ICD-10-CM

## 2020-10-23 DIAGNOSIS — U099 Post covid-19 condition, unspecified: Secondary | ICD-10-CM | POA: Insufficient documentation

## 2020-10-23 DIAGNOSIS — Z1159 Encounter for screening for other viral diseases: Secondary | ICD-10-CM

## 2020-10-23 MED ORDER — AZITHROMYCIN 500 MG PO TABS: 500.0000 mg | ORAL_TABLET | Freq: Every day | ORAL | 0 refills | Status: DC

## 2020-10-23 NOTE — Progress Notes (Addendum)
Name: Cynthia RotaCasey Villa   MRN: [id number]    DOB: May 20, 1981   Date:10/23/2020       Progress Note  Subjective  Chief Complaint  Chief Complaint  Patient presents with  . Annual Exam    HPI  Patient presents for annual CPE  Atopic dermatitis: recent flare on right hand, she states because she recently used a cleaner. 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  Pre-diabetes: she states everything tastes and smells like rotten fruit unless if it is coffee or bread, therefore her diet has not been very balanced. She denies polyphagia, polydipsia or polyuria.  Sinus pressure: she noticed increase of nasal congestion a couple of weeks ago, she states now is mostly on right maxillary sinus, she states symptoms were severe yesterday. She states Neti Pot helps drain mucus, but gets thick afterwards again , she has post-nasal drainage at night, low grade fever around 99. She has seasonal allergies , she has been using Dymista , she also takes Xyzal .   Diet: not very balanced currently  Exercise: not currently   Flowsheet Row [facility]  AUDIT-C Score 0     Depression: Phq 9 is  negative Depression screen [facility] 2/9 10/23/2020 05/04/2018 11/01/2017 06/23/2017 04/11/2016  Decreased Interest 0 0 0 0 0  Down, Depressed, Hopeless 0 0 0 0 0  PHQ - 2 Score 0 0 0 0 0  Altered sleeping 0 0 - - -  Tired, decreased energy 1 1 - - -  Change in appetite 0 0 - - -  Feeling bad or failure about yourself  0 0 - - -  Trouble concentrating 0 0 - - -  Moving slowly or fidgety/restless 0 0 - - -  Suicidal thoughts 0 0 - - -  PHQ-9 Score 1 1 - - -  Difficult doing work/chores Not difficult at all Not difficult at all - - -    Hypertension: BP Readings from Last 3 Encounters:  10/23/20 132/74  03/14/20 114/67  03/11/20 (!) 112/62   Obesity: Wt Readings from Last 3 Encounters:  10/23/20 203 lb 14.4 oz (92.5 kg)  08/16/18 212 lb 4.8 oz (96.3 kg)  07/20/18 206 lb 6.4 oz (93.6 kg)   BMI Readings from Last 3 Encounters:  10/23/20 37.29 kg/m  08/16/18 38.83 kg/m  07/20/18 37.75 kg/m     Vaccines:   HPV: up to at age 926 , ask insurance if age between 4127-45  Flu:  educated and discussed with patient.  Hep C Screening: today   STD testing and prevention (HIV/chl/gon/syphilis): not interested  Intimate partner violence: negative screen  Sexual History : no problems  Menstrual History/LMP/Abnormal Bleeding: regular, today is day 3 and is heavy, she has paraguard and is do to have it replaced, she will contact gyn  Incontinence Symptoms: no problems   Breast cancer:  Start screen at age 40   Osteoporosis: Discussed high calcium and vitamin D supplementation, weight bearing exercises  Cervical cancer screening: on her cycle, low risk , last normal pap and negative HPV, recheck every 5 years   Skin cancer: Discussed monitoring for atypical lesions    Advanced Care Planning: A voluntary discussion about advance care  planning including the explanation and discussion of advance directives.  Discussed health care proxy and Living will, and the patient was able to identify a health care proxy as husband .  Patient does not have a living will at present time.  Lipids: Lab Results  Component Value Date   CHOL 157 11/01/2017   CHOL 203 (H) 03/01/2016   CHOL 188 03/04/2015   Lab Results  Component Value Date   HDL 50 (L) 11/01/2017   HDL 61 03/01/2016   HDL 55 03/04/2015   Lab Results  Component Value Date   LDLCALC 84 11/01/2017   LDLCALC 117 03/01/2016   LDLCALC 110 (H) 03/04/2015   Lab Results  Component Value Date   TRIG 135 11/01/2017   TRIG 125 03/01/2016   TRIG 117 03/04/2015   Lab  Results  Component Value Date   CHOLHDL 3.1 11/01/2017   CHOLHDL 3.3 03/01/2016   CHOLHDL 3.4 03/04/2015   No results found for: LDLDIRECT  Glucose: Glucose, Bld  Date Value Ref Range Status  05/04/2018 87 65 - 139 mg/dL Final    Comment:    .        Non-fasting reference interval .   11/01/2017 95 65 - 99 mg/dL Final    Comment:    .            Fasting reference interval .   04/11/2016 102 (H) 65 - 99 mg/dL Final    Patient Active Problem List   Diagnosis Date Noted  . Allergic rhinitis 10/23/2020  . Allergic rhinitis due to animal (cat) (dog) hair and dander 10/23/2020  . Allergic rhinitis due to pollen 10/23/2020  . Atopic dermatitis 10/23/2020  . Cough, unspecified 10/23/2020  . COVID-19 long hauler 10/23/2020  . Pre-diabetes 10/23/2020  . Complication associated with orthopedic device (HCC) 06/05/2018  . Sprain of wrist 06/05/2018  . History of gestational diabetes 11/01/2017  . Migraine headache without aura 08/30/2017  . Urticaria 09/15/2016  . 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 History of thyroid nodule 03/04/2015  . Allergic contact dermatitis 03/03/2015  . Allergic state 03/03/2015  . Allergic to food 03/03/2015  . History of hyperthyroidism 06/17/2011  . Subclinical hypothyroidism 06/17/2011    Past Surgical History:  Procedure Laterality Date  . WRIST FRACTURE SURGERY Left 09/08/09   Repaired- August 2004    Family History  Problem Relation Age of Onset  . Diabetes Mother        DM-2nd to chronic pancreatitis  . Alcohol abuse Mother   . Cancer Mother        Breast  . Cholelithiasis Mother   . Lung cancer Mother        Tobacco smoker  . COPD Mother   . Sleep apnea Father   . Hypertension Father   . PKU Son   . Polycystic ovary syndrome Sister   . Cholelithiasis Sister   . COPD Maternal Grandmother   . Emphysema Maternal Grandmother        Tobacco smoker  . Prostate cancer Paternal Grandfather   . Breast cancer Cousin   . Cancer  Paternal Aunt     Social History   Socioeconomic History  . Marital status: Married    Spouse name: Ladene Artist  . Number of children: 5  . Years of education: Not on file  . Highest education level: Bachelor's degree (e.g., BA, AB, BS)  Occupational History  . Not on file  Tobacco Use  . Smoking status: Never Smoker  .  Smokeless tobacco: Never Used  Vaping Use  . Vaping Use: Never used  Substance and Sexual Activity  . Alcohol use: No    Alcohol/week: 0.0 standard drinks  . Drug use: No  . Sexual activity: Yes    Partners: Male    Birth control/protection: I.U.D.  Other Topics Concern  . Not on file  Social History Narrative   She is married, 3 biological children and 2 adopted children    Social Determinants of Health   Financial Resource Strain: Low Risk   . Difficulty of Paying Living Expenses: Not hard at all  Food Insecurity: No Food Insecurity  . Worried About Programme researcher, broadcasting/film/video in the Last Year: Never true  . Ran Out of Food in the Last Year: Never true  Transportation Needs: No Transportation Needs  . Lack of Transportation (Medical): No  . Lack of Transportation (Non-Medical): No  Physical Activity: Sufficiently Active  . Days of Exercise per Week: 5 days  . Minutes of Exercise per Session: 40 min  Stress: No Stress Concern Present  . Feeling of Stress : Not at all  Social Connections: Moderately Integrated  . Frequency of Communication with Friends and Family: More than three times a week  . Frequency of Social Gatherings with Friends and Family: Once a week  . Attends Religious Services: Never  . Active Member of Clubs or Organizations: Yes  . Attends Banker Meetings: More than 4 times per year  . Marital Status: Married  Catering manager Violence: Not At Risk  . Fear of Current or Ex-Partner: No  . Emotionally Abused: No  . Physically Abused: No  . Sexually Abused: No     Current Outpatient Medications:  .  albuterol (PROVENTIL)  (2.5 MG/3ML) 0.083% nebulizer solution, Take 3 mLs (2.5 mg total) by nebulization every 6 (six) hours as needed for wheezing or shortness of breath., Disp: 150 mL, Rfl: 1 .  Azelastine-Fluticasone 137-50 MCG/ACT SUSP, SHAKE LIQUID AND USE 2 SPRAYS IN EACH NOSTRIL DAILY, Disp: 23 g, Rfl: 3 .  EPINEPHrine 0.3 mg/0.3 mL IJ SOAJ injection, Inject into the muscle as directed., Disp: , Rfl:  .  Fluticasone-Salmeterol (ADVAIR) 250-50 MCG/DOSE AEPB, 1 puff, Disp: , Rfl:  .  hydrOXYzine (ATARAX/VISTARIL) 25 MG tablet, Take 25 mg by mouth every 8 (eight) hours as needed., Disp: , Rfl:  .  levocetirizine (XYZAL) 5 MG tablet, Take 1 tablet (5 mg total) by mouth daily., Disp: 30 tablet, Rfl: 3 .  PARAGARD INTRAUTERINE COPPER IUD IUD, by Intrauterine route., Disp: , Rfl:  .  tacrolimus (PROTOPIC) 0.1 % ointment, Apply topically 2 (two) times daily., Disp: , Rfl:  .  triamcinolone cream (KENALOG) 0.1 %, Apply 1 application topically 2 (two) times daily., Disp: 80 g, Rfl: 2  Allergies  Allergen Reactions  . Cetirizine Other (See Comments)    Other Reaction: CNS Disorder  . Dog Epithelium   . Monistat  [Miconazole] Nausea And Vomiting  . Orange Fruit [Citrus] Swelling  . Penicillins Hives  . Permethrin     rash  . Sudafed  [Pseudoephedrine]     Other reaction(s): Muscle Pain  . Banana Rash  . Pineapple Rash     ROS  Constitutional: Negative for fever or weight change.  Respiratory: Negative for cough and shortness of breath.   Cardiovascular: Negative for chest pain or palpitations.  Gastrointestinal: Negative for abdominal pain, no bowel changes.  Musculoskeletal: Negative for gait problem or joint swelling.  Skin: Negative  for rash.  Neurological: Negative for dizziness or headache.  No other specific complaints in a complete review of systems (except as listed in HPI above).  Objective  Vitals:   10/23/20 1529  BP: 132/74  Pulse: 100  Resp: 16  Temp: 99 F (37.2 C)  TempSrc: Oral   SpO2: 100%  Weight: 203 lb 14.4 oz (92.5 kg)  Height: 5\' 2"  (1.575 m)    Body mass index is 37.29 kg/m.  Physical Exam  Constitutional: Patient appears well-developed and well-nourished. No distress.  HENT: Head: Normocephalic and atraumatic. Ears: B TMs ok, no erythema or effusion; Nose: Not done  Mouth/Throat: not done , tender during percussion of right maxillary sinus Eyes: Conjunctivae and EOM are normal. Pupils are equal, round, and reactive to light. No scleral icterus.  Neck: Normal range of motion. Neck supple. No JVD present. No thyromegaly present.  Cardiovascular: Normal rate, regular rhythm and normal heart sounds.  No murmur heard. No BLE edema. Pulmonary/Chest: Effort normal and breath sounds normal. No respiratory distress. Abdominal: Soft. Bowel sounds are normal, no distension. There is no tenderness. no masses Breast: breast lump at 1 o'clock left breast about 3 inches from nipple FEMALE GENITALIA:  Not done  RECTAL: not done  Musculoskeletal: Normal range of motion, no joint effusions. No gross deformities Neurological: he is alert and oriented to person, place, and time. No cranial nerve deficit. Coordination, balance, strength, speech and gait are normal.  Skin: eczema of right hand Psychiatric: Patient has a normal mood and affect. behavior is normal. Judgment and thought content normal.  Fall Risk: Fall Risk  10/23/2020 08/16/2018 07/20/2018 07/17/2018 05/04/2018  Falls in the past year? 0 0 0 0 No  Number falls in past yr: 0 0 - - -  Comment - - - - -  Injury with Fall? - 0 - - -  Follow up Falls prevention discussed - - - -     Functional Status Survey: Is the patient deaf or have difficulty hearing?: No Does the patient have difficulty seeing, even when wearing glasses/contacts?: No Does the patient have difficulty concentrating, remembering, or making decisions?: No Does the patient have difficulty walking or climbing stairs?: No Does the patient have  difficulty dressing or bathing?: No Does the patient have difficulty doing errands alone such as visiting a doctor's office or shopping?: No   Assessment & Plan  1. Well adult exam  - CBC with Differential/Platelet - COMPLETE METABOLIC PANEL WITH GFR - Lipid panel - Hemoglobin A1c  2. Needs flu shot  Today   3. Need for hepatitis C screening test  - Hepatitis C Antibody  4. Need for Tdap vaccination  tdap  5. Pre-diabetes  - Hemoglobin A1c  6. Lipid screening  - Lipid panel  7. History of iron deficiency  - CBC with Differential/Platelet - Fe+TIBC+Fer  8. Atopic dermatitis, unspecified type   9. History of hyperthyroidism  - TSH - T3, free  10. COVID-19 long hauler  Keep follow up with cardiologist   11. Left breast mass  - MM Digital Diagnostic Bilat; Future - 05/06/2018 Unlisted Procedure Breast; Future  12. Right maxillary sinusitis  - azithromycin (ZITHROMAX) 500 MG tablet; Take 1 tablet (500 mg total) by mouth daily.  Dispense: 3 tablet; Refill: 0   -USPSTF grade A and B recommendations reviewed with patient; age-appropriate recommendations, preventive care, screening tests, etc discussed and encouraged; healthy living encouraged; see AVS for patient education given to patient -Discussed importance of 150  minutes of physical activity weekly, eat two servings of fish weekly, eat one serving of tree nuts ( cashews, pistachios, pecans, almonds.Marland Kitchen) every other day, eat 6 servings of fruit/vegetables daily and drink plenty of water and avoid sweet beverages.

## 2020-10-23 NOTE — Addendum Note (Signed)
Addended by: Benay Pike on: 10/23/2020 04:37 PM   Modules accepted: Orders

## 2020-10-23 NOTE — Patient Instructions (Signed)
Preventive Care 21-39 Years Old, Female Preventive care refers to lifestyle choices and visits with your health care provider that can promote health and wellness. This includes:  A yearly physical exam. This is also called an annual wellness visit.  Regular dental and eye exams.  Immunizations.  Screening for certain conditions.  Healthy lifestyle choices, such as: ? Eating a healthy diet. ? Getting regular exercise. ? Not using drugs or products that contain nicotine and tobacco. ? Limiting alcohol use. What can I expect for my preventive care visit? Physical exam Your health care provider may check your:  Height and weight. These may be used to calculate your BMI (body mass index). BMI is a measurement that tells if you are at a healthy weight.  Heart rate and blood pressure.  Body temperature.  Skin for abnormal spots. Counseling Your health care provider may ask you questions about your:  Past medical problems.  Family's medical history.  Alcohol, tobacco, and drug use.  Emotional well-being.  Home life and relationship well-being.  Sexual activity.  Diet, exercise, and sleep habits.  Work and work environment.  Access to firearms.  Method of birth control.  Menstrual cycle.  Pregnancy history. What immunizations do I need? Vaccines are usually given at various ages, according to a schedule. Your health care provider will recommend vaccines for you based on your age, medical history, and lifestyle or other factors, such as travel or where you work.   What tests do I need? Blood tests  Lipid and cholesterol levels. These may be checked every 5 years starting at age 20.  Hepatitis C test.  Hepatitis B test. Screening  Diabetes screening. This is done by checking your blood sugar (glucose) after you have not eaten for a while (fasting).  STD (sexually transmitted disease) testing, if you are at risk.  BRCA-related cancer screening. This may be  done if you have a family history of breast, ovarian, tubal, or peritoneal cancers.  Pelvic exam and Pap test. This may be done every 3 years starting at age 21. Starting at age 30, this may be done every 5 years if you have a Pap test in combination with an HPV test. Talk with your health care provider about your test results, treatment options, and if necessary, the need for more tests.   Follow these instructions at home: Eating and drinking  Eat a healthy diet that includes fresh fruits and vegetables, whole grains, lean protein, and low-fat dairy products.  Take vitamin and mineral supplements as recommended by your health care provider.  Do not drink alcohol if: ? Your health care provider tells you not to drink. ? You are pregnant, may be pregnant, or are planning to become pregnant.  If you drink alcohol: ? Limit how much you have to 0-1 drink a day. ? Be aware of how much alcohol is in your drink. In the [place] one drink equals one 12 oz bottle of beer (355 mL), one 5 oz glass of wine (148 mL), or one 1 oz glass of hard liquor (44 mL).   Lifestyle  Take daily care of your teeth and gums. Brush your teeth every morning and night with fluoride toothpaste. Floss one time each day.  Stay active. Exercise for at least 30 minutes 5 or more days each week.  Do not use any products that contain nicotine or tobacco, such as cigarettes, e-cigarettes, and chewing tobacco. If you need help quitting, ask your health care provider.  Do not   use drugs.  If you are sexually active, practice safe sex. Use a condom or other form of protection to prevent STIs (sexually transmitted infections).  If you do not wish to become pregnant, use a form of birth control. If you plan to become pregnant, see your health care provider for a prepregnancy visit.  Find healthy ways to cope with stress, such as: ? Meditation, yoga, or listening to music. ? Journaling. ? Talking to a trusted  person. ? Spending time with friends and family. Safety  Always wear your seat belt while driving or riding in a vehicle.  Do not drive: ? If you have been drinking alcohol. Do not ride with someone who has been drinking. ? When you are tired or distracted. ? While texting.  Wear a helmet and other protective equipment during sports activities.  If you have firearms in your house, make sure you follow all gun safety procedures.  Seek help if you have been physically or sexually abused. What's next?  Go to your health care provider once a year for an annual wellness visit.  Ask your health care provider how often you should have your eyes and teeth checked.  Stay up to date on all vaccines. This information is not intended to replace advice given to you by your health care provider. Make sure you discuss any questions you have with your health care provider. Document Revised: 03/29/2020 Document Reviewed: 04/12/2018 Elsevier Patient Education  2021 [employer].

## 2020-10-26 LAB — COMPLETE METABOLIC PANEL WITH GFR
AG Ratio: 1.5 (calc) (ref 1.0–2.5)
ALT: 18 U/L (ref 6–29)
AST: 20 U/L (ref 10–30)
Albumin: 4.3 g/dL (ref 3.6–5.1)
Alkaline phosphatase (APISO): 52 U/L (ref 31–125)
BUN: 8 mg/dL (ref 7–25)
CO2: 27 mmol/L (ref 20–32)
Calcium: 9.2 mg/dL (ref 8.6–10.2)
Chloride: 104 mmol/L (ref 98–110)
Creat: 0.98 mg/dL (ref 0.50–1.10)
GFR, Est African American: 85 mL/min/{1.73_m2} (ref >=60)
GFR, Est Non African American: 73 mL/min/{1.73_m2} (ref >=60)
Globulin: 2.8 g/dL (calc) (ref 1.9–3.7)
Glucose, Bld: 84 mg/dL (ref 65–99)
Potassium: 4.3 mmol/L (ref 3.5–5.3)
Sodium: 140 mmol/L (ref 135–146)
Total Bilirubin: 0.4 mg/dL (ref 0.2–1.2)
Total Protein: 7.1 g/dL (ref 6.1–8.1)

## 2020-10-26 LAB — CBC WITH DIFFERENTIAL/PLATELET
Absolute Monocytes: 454 cells/uL (ref 200–950)
Basophils Absolute: 84 cells/uL (ref 0–200)
Basophils Relative: 1 %
Eosinophils Absolute: 218 cells/uL (ref 15–500)
Eosinophils Relative: 2.6 %
HCT: 42.3 % (ref 35.0–45.0)
Hemoglobin: 14 g/dL (ref 11.7–15.5)
Lymphs Abs: 2302 cells/uL (ref 850–3900)
MCH: 29 pg (ref 27.0–33.0)
MCHC: 33.1 g/dL (ref 32.0–36.0)
MCV: 87.6 fL (ref 80.0–100.0)
MPV: 11.4 fL (ref 7.5–12.5)
Monocytes Relative: 5.4 %
Neutro Abs: 5342 cells/uL (ref 1500–7800)
Neutrophils Relative %: 63.6 %
Platelets: 323 10*3/uL (ref 140–400)
RBC: 4.83 10*6/uL (ref 3.80–5.10)
RDW: 13.6 % (ref 11.0–15.0)
Total Lymphocyte: 27.4 %
WBC: 8.4 10*3/uL (ref 3.8–10.8)

## 2020-10-26 LAB — LIPID PANEL
Cholesterol: 199 mg/dL (ref <200)
HDL: 60 mg/dL (ref >=50)
LDL Cholesterol (Calc): 112 mg/dL (calc) — ABNORMAL HIGH
Non-HDL Cholesterol (Calc): 139 mg/dL (calc) — ABNORMAL HIGH (ref <130)
Total CHOL/HDL Ratio: 3.3 (calc) (ref <5.0)
Triglycerides: 159 mg/dL — ABNORMAL HIGH (ref <150)

## 2020-10-26 LAB — HEMOGLOBIN A1C
Hgb A1c MFr Bld: 5.9 % of total Hgb — ABNORMAL HIGH (ref <5.7)
Mean Plasma Glucose: 123 mg/dL
eAG (mmol/L): 6.8 mmol/L

## 2020-10-26 LAB — IRON,TIBC AND FERRITIN PANEL
%SAT: 11 % (calc) — ABNORMAL LOW (ref 16–45)
Ferritin: 7 ng/mL — ABNORMAL LOW (ref 16–154)
Iron: 47 ug/dL (ref 40–190)
TIBC: 420 mcg/dL (calc) (ref 250–450)

## 2020-10-26 LAB — TSH: TSH: 1.27 mIU/L

## 2020-10-26 LAB — HEPATITIS C ANTIBODY
Hepatitis C Ab: NONREACTIVE
SIGNAL TO CUT-OFF: 0 (ref <1.00)

## 2020-10-26 LAB — T3, FREE: T3, Free: 3.1 pg/mL (ref 2.3–4.2)

## 2020-11-02 ENCOUNTER — Other Ambulatory Visit: Payer: Self-pay | Admitting: Family Medicine

## 2020-11-02 DIAGNOSIS — N6320 Unspecified lump in the left breast, unspecified quadrant: Secondary | ICD-10-CM

## 2020-11-02 DIAGNOSIS — Z1231 Encounter for screening mammogram for malignant neoplasm of breast: Secondary | ICD-10-CM

## 2020-11-12 ENCOUNTER — Other Ambulatory Visit: Payer: Self-pay | Admitting: Family Medicine

## 2020-11-12 ENCOUNTER — Ambulatory Visit
Admission: RE | Admit: 2020-11-12 | Discharge: 2020-11-12 | Disposition: A | Discharge status: Home / Self Care | Payer: 59 | Source: Ambulatory Visit | Attending: Family Medicine | Admitting: Family Medicine

## 2020-11-12 ENCOUNTER — Other Ambulatory Visit: Payer: Self-pay

## 2020-11-12 DIAGNOSIS — Z1231 Encounter for screening mammogram for malignant neoplasm of breast: Secondary | ICD-10-CM | POA: Insufficient documentation

## 2020-11-12 DIAGNOSIS — N63 Unspecified lump in unspecified breast: Secondary | ICD-10-CM | POA: Insufficient documentation

## 2020-11-12 DIAGNOSIS — N6320 Unspecified lump in the left breast, unspecified quadrant: Secondary | ICD-10-CM | POA: Diagnosis present

## 2021-02-16 ENCOUNTER — Encounter: Payer: Self-pay | Admitting: Emergency Medicine

## 2021-02-16 ENCOUNTER — Ambulatory Visit
Admission: EM | Admit: 2021-02-16 | Discharge: 2021-02-16 | Disposition: A | Discharge status: Home / Self Care | Payer: 59 | Attending: Emergency Medicine | Admitting: Emergency Medicine

## 2021-02-16 ENCOUNTER — Other Ambulatory Visit: Payer: Self-pay

## 2021-02-16 DIAGNOSIS — J069 Acute upper respiratory infection, unspecified: Secondary | ICD-10-CM

## 2021-02-16 LAB — POCT RAPID STREP A (OFFICE): Rapid Strep A Screen: NEGATIVE

## 2021-02-16 NOTE — ED Provider Notes (Signed)
Renaldo Fiddler    CSN: [id number] Arrival date & time: 02/16/21  1159      History   Chief Complaint Chief Complaint  Patient presents with   Sore Throat     HPI Cynthia Villa is a 40 y.o. female.  Patient presents with sore throat x2 days.  She also reports postnasal drip and mild cough.  She denies fever, chills, rash, shortness of breath, vomiting, diarrhea, or other symptoms.  Treatment attempted at home with hot tea.  Patient's medical history includes allergies, eczema, COVID long hauler.  The history is provided by the patient and medical records.   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   Pre-diabetes 10/23/2020   Complication associated with orthopedic device (HCC) 06/05/2018   Sprain of wrist 06/05/2018   History of gestational diabetes 11/01/2017   Migraine headache without aura 08/30/2017   Urticaria 09/15/2016   Disorder of mitral valve 03/04/2015   History of thyroid nodule 03/04/2015   Allergic contact dermatitis 03/03/2015   Allergic state 03/03/2015   Allergic to food 03/03/2015   History of hyperthyroidism 06/17/2011   Subclinical hypothyroidism 06/17/2011    Past Surgical History:  Procedure Laterality Date   WRIST FRACTURE SURGERY Left 09/08/09   Repaired- August 2004    OB History   No obstetric history on file.      Home Medications    Prior to Admission medications   Medication Sig Start Date End Date Taking? Authorizing Provider  albuterol (PROVENTIL) (2.5 MG/3ML) 0.083% nebulizer solution Take 3 mLs (2.5 mg total) by nebulization every 6 (six) hours as needed for wheezing or  shortness of breath. 07/17/18   Poulose, Percell Belt, NP  Azelastine-Fluticasone 137-50 MCG/ACT SUSP SHAKE LIQUID AND USE 2 SPRAYS IN EACH NOSTRIL DAILY 12/03/18   Poulose, Percell Belt, NP  azithromycin (ZITHROMAX) 500 MG tablet Take 1 tablet (500 mg total) by mouth daily. 10/23/20   Alba Cory, MD  EPINEPHrine 0.3 mg/0.3 mL IJ SOAJ injection Inject into the muscle as directed. 05/21/20   [provider]  Fluticasone-Salmeterol (ADVAIR) 250-50 MCG/DOSE AEPB 1 puff    [provider]  hydrOXYzine (ATARAX/VISTARIL) 25 MG tablet Take 25 mg by mouth every 8 (eight) hours as needed. 05/21/20   [provider]  levocetirizine (XYZAL) 5 MG tablet Take 1 tablet (5 mg total) by mouth daily. 08/03/15   Alba Cory, MD  PARAGARD INTRAUTERINE COPPER IUD IUD by Intrauterine route.    [provider]  tacrolimus (PROTOPIC) 0.1 % ointment Apply topically 2 (two) times daily. 05/21/20   [provider]  triamcinolone cream (KENALOG) 0.1 % Apply 1 application topically 2 (two) times daily. 02/14/18   Doren Custard, FNP    Family History Family History  Problem Relation Age of Onset   Diabetes Mother        DM-2nd to chronic pancreatitis   Alcohol abuse Mother    Cancer Mother        Breast   Cholelithiasis Mother    Lung cancer Mother        Tobacco smoker  COPD Mother    Breast cancer Mother 85   Sleep apnea Father    Hypertension Father    PKU Son    Polycystic ovary syndrome Sister    Cholelithiasis Sister    COPD Maternal Grandmother    Emphysema Maternal Grandmother        Tobacco smoker   Prostate cancer Paternal Grandfather    Breast cancer Cousin        late 30's   Cancer Paternal Aunt    Breast cancer Maternal Aunt 64    Social History Social History   Tobacco Use   Smoking status: Never   Smokeless tobacco: Never  Vaping Use   Vaping Use: Never used  Substance Use Topics   Alcohol use: No    Alcohol/week: 0.0 standard drinks    Drug use: No     Allergies   Cetirizine, Dog epithelium, Monistat  [miconazole], Orange fruit [citrus], Penicillins, Permethrin, Sudafed  [pseudoephedrine], Banana, and Pineapple   Review of Systems Review of Systems  Constitutional:  Negative for chills and fever.  HENT:  Positive for postnasal drip and sore throat. Negative for ear pain.   Respiratory:  Positive for cough. Negative for shortness of breath.   Cardiovascular:  Negative for chest pain and palpitations.  Gastrointestinal:  Negative for abdominal pain, diarrhea and vomiting.  Skin:  Negative for color change and rash.  All other systems reviewed and are negative.   Physical Exam Triage Vital Signs ED Triage Vitals  Enc Vitals Group     BP      Pulse      Resp      Temp      Temp src      SpO2      Weight      Height      Head Circumference      Peak Flow      Pain Score      Pain Loc      Pain Edu?      Excl. in GC?    No data found.  Updated Vital Signs BP 114/89 (BP Location: Left Arm)   Pulse 100   Temp 99.5 F (37.5 C) (Oral)   Resp 16   SpO2 96%   Visual Acuity Right Eye Distance:   Left Eye Distance:   Bilateral Distance:    Right Eye Near:   Left Eye Near:    Bilateral Near:     Physical Exam Vitals and nursing note reviewed.  Constitutional:      General: She is not in acute distress.    Appearance: She is well-developed. She is not ill-appearing.  HENT:     Head: Normocephalic and atraumatic.     Right Ear: Tympanic membrane normal.     Left Ear: Tympanic membrane normal.     Nose: Nose normal.     Mouth/Throat:     Mouth: Mucous membranes are moist.     Pharynx: Posterior oropharyngeal erythema present.  Eyes:     Conjunctiva/sclera: Conjunctivae normal.  Cardiovascular:     Rate and Rhythm: Normal rate and regular rhythm.     Heart sounds: Normal heart sounds.  Pulmonary:     Effort: Pulmonary effort is normal. No respiratory distress.     Breath sounds: Normal  breath sounds.  Abdominal:     Palpations: Abdomen is soft.     Tenderness: There is no abdominal tenderness.  Musculoskeletal:     Cervical back: Neck supple.  Skin:    General: Skin is warm and dry.  Neurological:     General: No focal deficit present.     Mental Status: She is alert and oriented to person, place, and time.     Gait: Gait normal.  Psychiatric:        Mood and Affect: Mood normal.        Behavior: Behavior normal.     UC Treatments / Results  Labs (all labs ordered are listed, but only abnormal results are displayed) Labs Reviewed  CULTURE, GROUP A STREP ([facility])  NOVEL CORONAVIRUS, NAA  POCT RAPID STREP A (OFFICE)    EKG   Radiology No results found.  Procedures Procedures (including critical care time)  Medications Ordered in UC Medications - No data to display  Initial Impression / Assessment and Plan / UC Course  I have reviewed the triage vital signs and the nursing notes.  Pertinent labs & imaging results that were available during my care of the patient were reviewed by me and considered in my medical decision making (see chart for details).   Viral URI.  Rapid strep negative; culture pending. COVID pending.  Instructed patient to self quarantine per CDC guidelines.  Discussed symptomatic treatment including Tylenol or ibuprofen, rest, hydration.  Instructed patient to follow up with PCP if symptoms are not improving.  Patient agrees to plan of care.    Final Clinical Impressions(s) / UC Diagnoses   Final diagnoses:  Viral URI     Discharge Instructions      Your rapid strep test is negative.  A throat culture is pending; we will call you if it is positive requiring treatment.    Your COVID test is pending.  You should self quarantine until the test result is back.    Take Tylenol or ibuprofen as needed for fever or discomfort.  Rest and keep yourself hydrated.    Follow-up with your primary care provider if your symptoms are not  improving.         ED Prescriptions   None    PDMP not reviewed this encounter.   Mickie Bail, NP 02/16/21 1301

## 2021-02-16 NOTE — Discharge Instructions (Addendum)
Your rapid strep test is negative.  A throat culture is pending; we will call you if it is positive requiring treatment.    Your COVID test is pending.  You should self quarantine until the test result is back.    Take Tylenol or ibuprofen as needed for fever or discomfort.  Rest and keep yourself hydrated.    Follow-up with your primary care provider if your symptoms are not improving.     

## 2021-02-16 NOTE — ED Triage Notes (Signed)
Triaged by provider  

## 2021-02-17 LAB — NOVEL CORONAVIRUS, NAA: SARS-CoV-2, NAA: NOT DETECTED

## 2021-02-17 LAB — SARS-COV-2, NAA 2 DAY TAT

## 2021-02-19 LAB — CULTURE, GROUP A STREP (THRC)

## 2021-06-22 ENCOUNTER — Encounter: Payer: Self-pay | Admitting: Family Medicine

## 2021-07-21 ENCOUNTER — Encounter: Payer: Self-pay | Admitting: Emergency Medicine

## 2021-07-21 ENCOUNTER — Ambulatory Visit
Admission: EM | Admit: 2021-07-21 | Discharge: 2021-07-21 | Disposition: A | Discharge status: Home / Self Care | Payer: 59 | Attending: Emergency Medicine | Admitting: Emergency Medicine

## 2021-07-21 ENCOUNTER — Other Ambulatory Visit: Payer: Self-pay

## 2021-07-21 DIAGNOSIS — B349 Viral infection, unspecified: Secondary | ICD-10-CM

## 2021-07-21 NOTE — ED Triage Notes (Signed)
Pt here with cough, sore throat and fever x 3 days.

## 2021-07-21 NOTE — Discharge Instructions (Addendum)
Your COVID and Flu tests are pending.  You should self quarantine until the test results are back.    Take Tylenol or ibuprofen as needed for fever or discomfort.  Rest and keep yourself hydrated.    Follow-up with your primary care provider if your symptoms are not improving.     

## 2021-07-21 NOTE — ED Provider Notes (Signed)
Roderic Palau    CSN: [id number] Arrival date & time: 07/21/21  1236      History   Chief Complaint Chief Complaint  Patient presents with   Cough   Fever   Sore Throat    HPI Daejanae Dobey is a 40 y.o. female.  Patient presents with 3-day history of subjective fever, chills, congestion, sore throat, cough.  She denies rash, shortness of breath, vomiting, diarrhea, or other symptoms.  Treatment at home with cough drops and tea with honey.  Her medical history includes COVID long hauler, allergies, migraine headaches, prediabetes.   The history is provided by the patient and medical records.   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   Pre-diabetes [id number]   Complication associated with orthopedic device ([facility]) 06/05/2018   Sprain of wrist 06/05/2018   History of gestational diabetes 11/01/2017   Migraine headache without aura 08/30/2017   Urticaria 09/15/2016   Disorder of mitral valve 03/04/2015   History of thyroid nodule 03/04/2015   Allergic contact dermatitis 03/03/2015   Allergic state 03/03/2015   Allergic to food 03/03/2015   History of hyperthyroidism 06/17/2011   Subclinical hypothyroidism 06/17/2011    Past Surgical History:  Procedure Laterality Date   WRIST FRACTURE SURGERY Left 09/08/09   Repaired- August 2004    OB History   No obstetric history on file.      Home Medications    Prior to Admission medications   Medication Sig Start Date End Date Taking? Authorizing Provider  albuterol (PROVENTIL) (2.5 MG/3ML) 0.083% nebulizer solution Take 3 mLs (2.5 mg total) by nebulization every 6 (six)  hours as needed for wheezing or shortness of breath. 07/17/18   Poulose, Bethel Born, NP  Azelastine-Fluticasone 137-50 MCG/ACT SUSP SHAKE LIQUID AND USE 2 SPRAYS IN EACH NOSTRIL DAILY 12/03/18   Poulose, Bethel Born, NP  azithromycin (ZITHROMAX) 500 MG tablet Take 1 tablet (500 mg total) by mouth daily. 10/23/20   Steele Sizer, MD  EPINEPHrine 0.3 mg/0.3 mL IJ SOAJ injection Inject into the muscle as directed. 05/21/20   [provider]  Fluticasone-Salmeterol (ADVAIR) 250-50 MCG/DOSE AEPB 1 puff    [provider]  hydrOXYzine (ATARAX/VISTARIL) 25 MG tablet Take 25 mg by mouth every 8 (eight) hours as needed. 05/21/20   [provider]  levocetirizine (XYZAL) 5 MG tablet Take 1 tablet (5 mg total) by mouth daily. 08/03/15   Steele Sizer, MD  PARAGARD INTRAUTERINE COPPER IUD IUD by Intrauterine route.    [provider]  tacrolimus (PROTOPIC) 0.1 % ointment Apply topically 2 (two) times daily. 05/21/20   [provider]  triamcinolone cream (KENALOG) 0.1 % Apply 1 application topically 2 (two) times daily. 02/14/18   Hubbard Hartshorn, FNP    Family History Family History  Problem Relation Age of Onset   Diabetes Mother        DM-2nd to chronic pancreatitis   Alcohol abuse Mother    Cancer Mother        Breast   Cholelithiasis Mother    Lung cancer Mother  Tobacco smoker   COPD Mother    Breast cancer Mother 34   Sleep apnea Father    Hypertension Father    PKU Son    Polycystic ovary syndrome Sister    Cholelithiasis Sister    COPD Maternal Grandmother    Emphysema Maternal Grandmother        Tobacco smoker   Prostate cancer Paternal Grandfather    Breast cancer Cousin        late 30's   Cancer Paternal Aunt    Breast cancer Maternal Aunt 54    Social History Social History   Tobacco Use   Smoking status: Never   Smokeless tobacco: Never  Vaping Use   Vaping Use: Never used  Substance Use Topics   Alcohol use: No     Alcohol/week: 0.0 standard drinks   Drug use: No     Allergies   Cetirizine, Dog epithelium, Monistat  [miconazole], Orange fruit [citrus], Penicillins, Permethrin, Sudafed  [pseudoephedrine], Banana, and Pineapple   Review of Systems Review of Systems  Constitutional:  Positive for chills and fever.  HENT:  Positive for congestion and sore throat. Negative for ear pain.   Respiratory:  Positive for cough. Negative for shortness of breath.   Cardiovascular:  Negative for chest pain and palpitations.  Gastrointestinal:  Negative for diarrhea and vomiting.  Skin:  Negative for color change and rash.  All other systems reviewed and are negative.   Physical Exam Triage Vital Signs ED Triage Vitals  Enc Vitals Group     BP 07/21/21 1254 119/83     Pulse Rate 07/21/21 1254 (!) 117     Resp 07/21/21 1254 18     Temp 07/21/21 1254 99.7 F (37.6 C)     Temp Source 07/21/21 1254 Oral     SpO2 07/21/21 1254 98 %     Weight --      Height --      Head Circumference --      Peak Flow --      Pain Score 07/21/21 1251 4     Pain Loc --      Pain Edu? --      Excl. in GC? --    No data found.  Updated Vital Signs BP 119/83 (BP Location: Left Arm)   Pulse (!) 117   Temp 99.7 F (37.6 C) (Oral)   Resp 18   SpO2 98%   Visual Acuity Right Eye Distance:   Left Eye Distance:   Bilateral Distance:    Right Eye Near:   Left Eye Near:    Bilateral Near:     Physical Exam Vitals and nursing note reviewed.  Constitutional:      General: She is not in acute distress.    Appearance: She is well-developed.  HENT:     Right Ear: Tympanic membrane normal.     Left Ear: Tympanic membrane normal.     Nose: Nose normal.     Mouth/Throat:     Mouth: Mucous membranes are moist.     Pharynx: Oropharynx is clear.  Cardiovascular:     Rate and Rhythm: Normal rate and regular rhythm.     Heart sounds: Normal heart sounds.  Pulmonary:     Effort: Pulmonary effort is normal. No  respiratory distress.     Breath sounds: Normal breath sounds.  Abdominal:     Palpations: Abdomen is soft.     Tenderness: There is no abdominal tenderness.  Musculoskeletal:  Cervical back: Neck supple.  Skin:    General: Skin is warm and dry.  Neurological:     Mental Status: She is alert.  Psychiatric:        Mood and Affect: Mood normal.        Behavior: Behavior normal.     UC Treatments / Results  Labs (all labs ordered are listed, but only abnormal results are displayed) Labs Reviewed  COVID-19, FLU A+B NAA    EKG   Radiology No results found.  Procedures Procedures (including critical care time)  Medications Ordered in UC Medications - No data to display  Initial Impression / Assessment and Plan / UC Course  I have reviewed the triage vital signs and the nursing notes.  Pertinent labs & imaging results that were available during my care of the patient were reviewed by me and considered in my medical decision making (see chart for details).    Viral illness.  COVID and Flu pending.  Instructed patient to self quarantine per CDC guidelines.  Discussed symptomatic treatment including Tylenol or ibuprofen, rest, hydration.  Instructed patient to follow up with PCP if symptoms are not improving.  Patient agrees to plan of care.   Final Clinical Impressions(s) / UC Diagnoses   Final diagnoses:  Viral illness     Discharge Instructions      Your COVID and Flu tests are pending.  You should self quarantine until the test results are back.    Take Tylenol or ibuprofen as needed for fever or discomfort.  Rest and keep yourself hydrated.    Follow-up with your primary care provider if your symptoms are not improving.         ED Prescriptions   None    PDMP not reviewed this encounter.   Mickie Bail, NP 07/21/21 1324

## 2021-07-22 LAB — COVID-19, FLU A+B NAA
Influenza A, NAA: DETECTED — AB
Influenza B, NAA: NOT DETECTED
SARS-CoV-2, NAA: NOT DETECTED

## 2021-10-18 ENCOUNTER — Other Ambulatory Visit: Payer: Self-pay | Admitting: Family Medicine

## 2021-10-18 DIAGNOSIS — N6320 Unspecified lump in the left breast, unspecified quadrant: Secondary | ICD-10-CM

## 2021-10-26 IMAGING — MG DIGITAL DIAGNOSTIC BILAT W/ TOMO W/ CAD
7 of 17 series · 7 of 40 positions shown · non-contrast
Comparison: None.
COMPARISON: None.

Addendum:
CLINICAL DATA: Patient presents for palpable abnormality within the
left breast.



[R MLO synth-2D (1 of 2)]
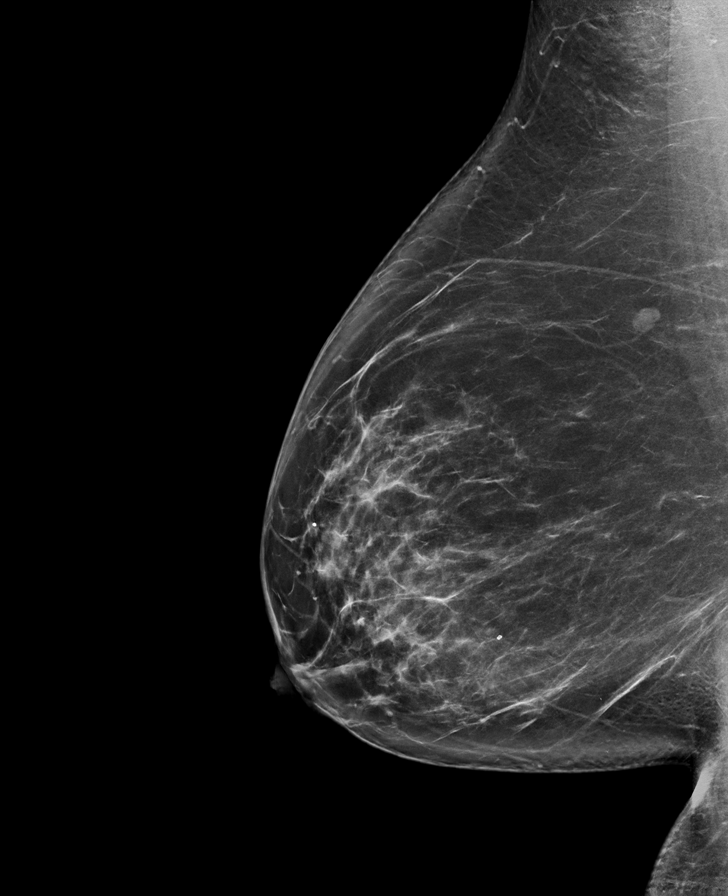

[R MLO synth-2D (2 of 2)]
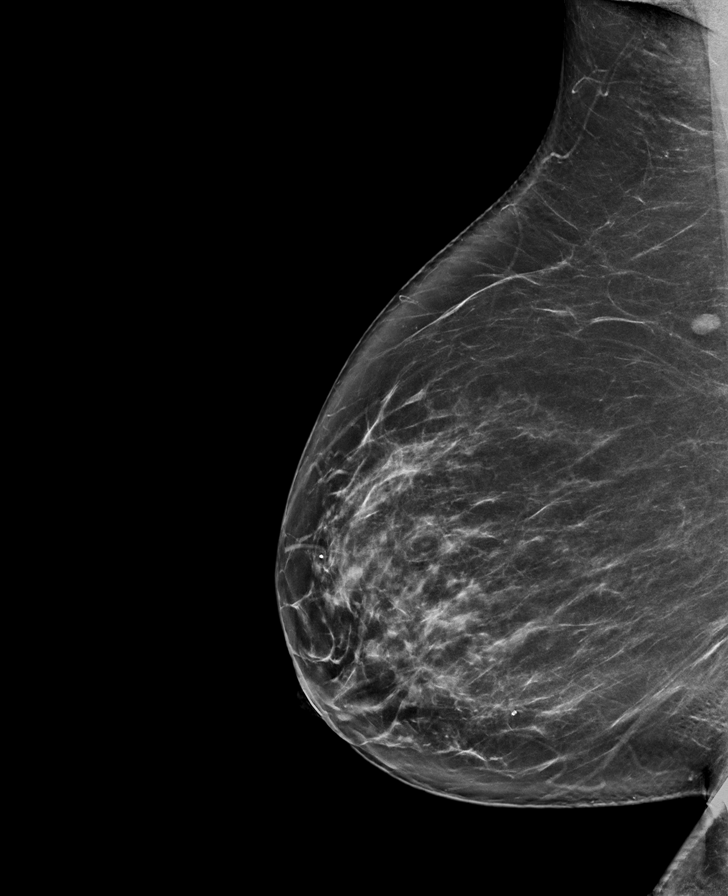

[R CC synth-2D (1 of 2)]
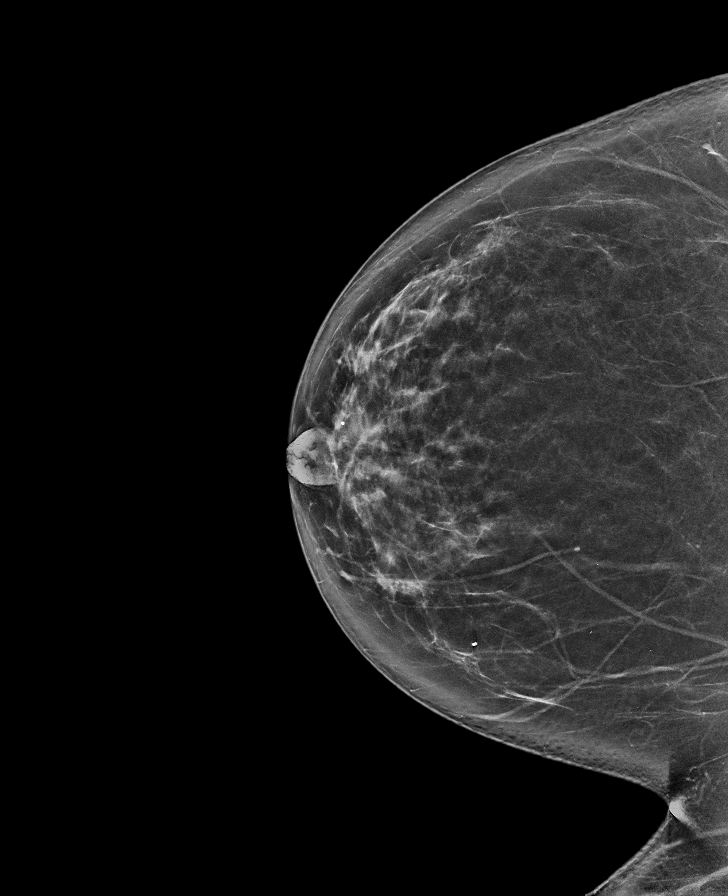

[L MLO synth-2D (1 of 2)]
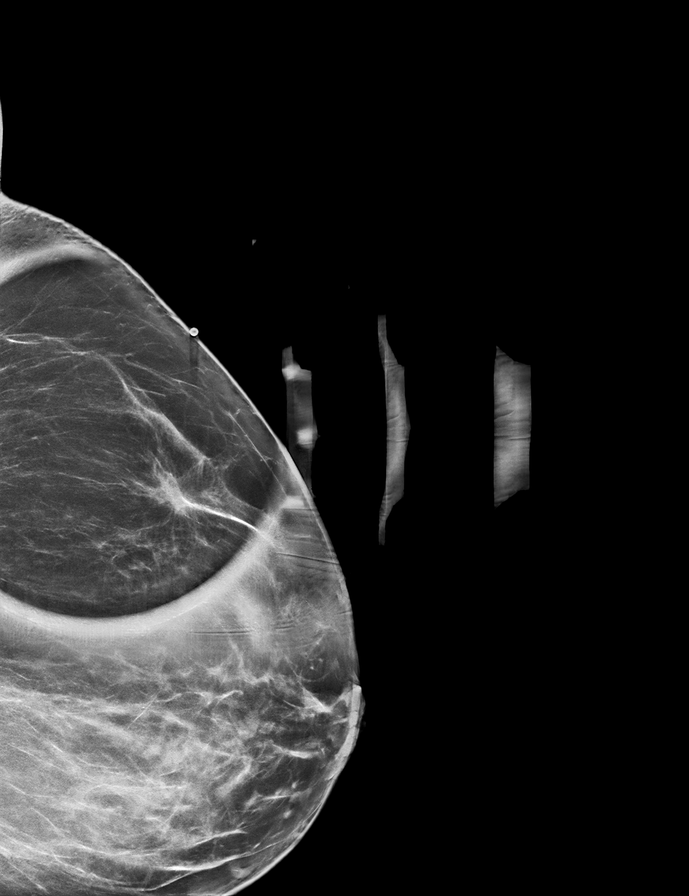

[L CC synth-2D]
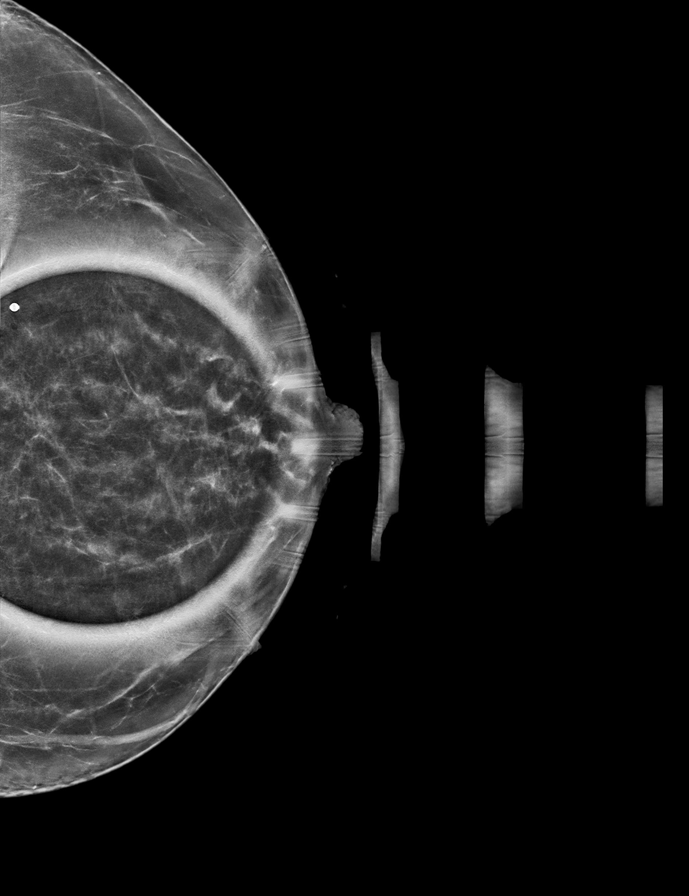

[L MLO synth-2D (2 of 2)]
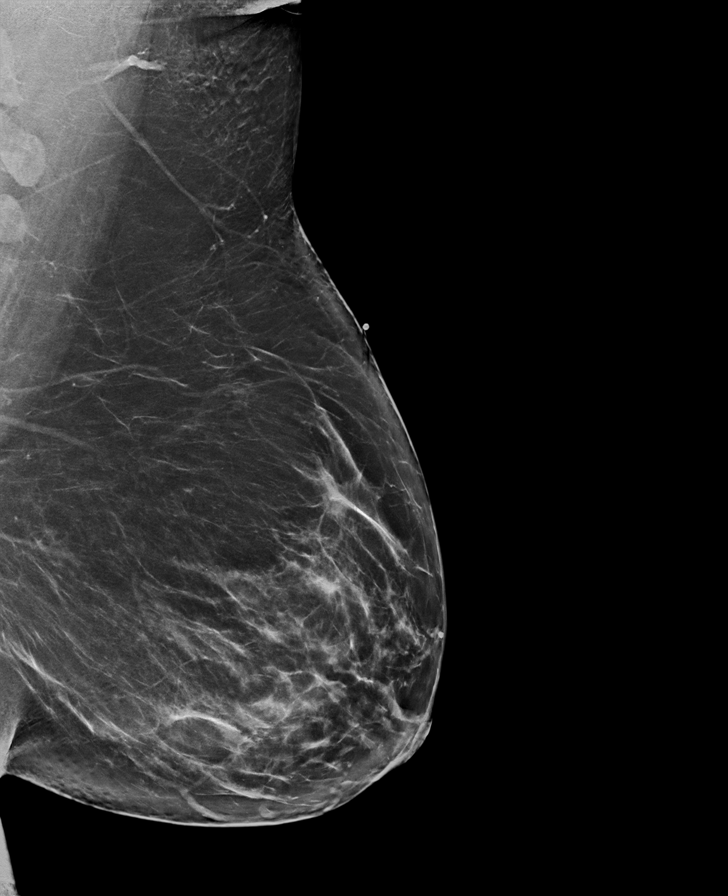

[R CC synth-2D (2 of 2)]
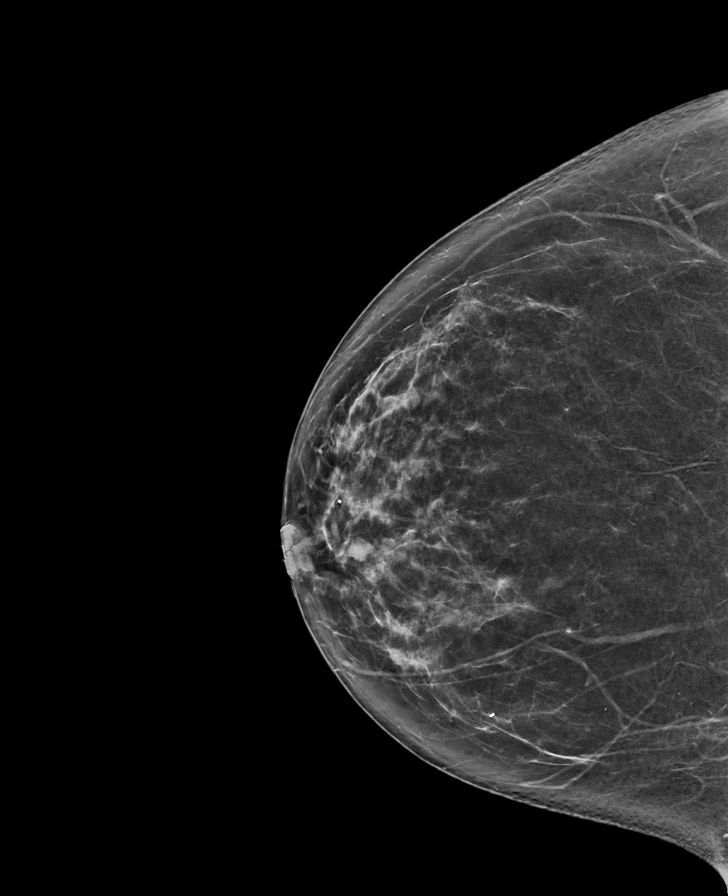

[7 of 40 positions shown; findings below may reference images not displayed]

ACR Breast Density Category c: The breast tissue is heterogeneously
dense, which may obscure small masses.
FINDINGS: No concerning abnormalities underlying the palpable marker within
the outer left breast. Within the inferior left breast middle depth
there is a persistent asymmetry evaluated with additional imaging.
Additionally there are multiple oval circumscribed masses within the
retroareolar right breast.

Targeted ultrasound is performed, showing a 1.0 x 0.2 x 0.7 cm cyst
left breast 6 o'clock position 2 cm from the nipple.

No concerning abnormality at the site of palpable concern left
breast 11 o'clock position 6 cm from nipple.

Within the right breast 12 o'clock position 2 cm from nipple there
is a 7 x 3 x 7 mm cyst.

Within the right breast 12 o'clock position 3 cm from nipple there
is a 15 x 3 x 14 mm cyst.

Within the right breast 1 o'clock position 3 cm from nipple there is
a 6 x 3 x 7 mm cluster of cysts.
IMPRESSION: Probably benign asymmetry inferior left breast.

Probably benign cluster of cysts right breast 1 o'clock position.

RECOMMENDATION:
Bilateral diagnostic mammography and right breast ultrasound with
possible left breast ultrasound to reassess the probably benign left
breast asymmetry and probably benign right breast mass 1 o'clock
position.

I have discussed the findings and recommendations with the patient.
If applicable, a reminder letter will be sent to the patient
regarding the next appointment.

BI-RADS CATEGORY  3: Probably benign.

ADDENDUM:
Addendum for correction of recommendation timeframe.

Bilateral diagnostic mammography and right breast ultrasound
possible left breast ultrasound to reassess the probably left breast
asymmetry and probably benign right breast mass 1 o'clock position
in 6 months.

*** End of Addendum ***
ACR Breast Density Category c: The breast tissue is heterogeneously
dense, which may obscure small masses.
FINDINGS: No concerning abnormalities underlying the palpable marker within
the outer left breast. Within the inferior left breast middle depth
there is a persistent asymmetry evaluated with additional imaging.
Additionally there are multiple oval circumscribed masses within the
retroareolar right breast.

Targeted ultrasound is performed, showing a 1.0 x 0.2 x 0.7 cm cyst
left breast 6 o'clock position 2 cm from the nipple.

No concerning abnormality at the site of palpable concern left
breast 11 o'clock position 6 cm from nipple.

Within the right breast 12 o'clock position 2 cm from nipple there
is a 7 x 3 x 7 mm cyst.

Within the right breast 12 o'clock position 3 cm from nipple there
is a 15 x 3 x 14 mm cyst.

Within the right breast 1 o'clock position 3 cm from nipple there is
a 6 x 3 x 7 mm cluster of cysts.
IMPRESSION: Probably benign asymmetry inferior left breast.

Probably benign cluster of cysts right breast 1 o'clock position.

RECOMMENDATION:
Bilateral diagnostic mammography and right breast ultrasound with
possible left breast ultrasound to reassess the probably benign left
breast asymmetry and probably benign right breast mass 1 o'clock
position.

I have discussed the findings and recommendations with the patient.
If applicable, a reminder letter will be sent to the patient
regarding the next appointment.

BI-RADS CATEGORY  3: Probably benign.

## 2021-10-28 NOTE — Patient Instructions (Signed)
Preventive Care 40-40 Years Old, Female ?Preventive care refers to lifestyle choices and visits with your health care provider that can promote health and wellness. Preventive care visits are also called wellness exams. ?What can I expect for my preventive care visit? ?Counseling ?During your preventive care visit, your health care provider may ask about your: ?Medical history, including: ?Past medical problems. ?Family medical history. ?Pregnancy history. ?Current health, including: ?Menstrual cycle. ?Method of birth control. ?Emotional well-being. ?Home life and relationship well-being. ?Sexual activity and sexual health. ?Lifestyle, including: ?Alcohol, nicotine or tobacco, and drug use. ?Access to firearms. ?Diet, exercise, and sleep habits. ?Work and work Statistician. ?Sunscreen use. ?Safety issues such as seatbelt and bike helmet use. ?Physical exam ?Your health care provider may check your: ?Height and weight. These may be used to calculate your BMI (body mass index). BMI is a measurement that tells if you are at a healthy weight. ?Waist circumference. This measures the distance around your waistline. This measurement also tells if you are at a healthy weight and may help predict your risk of certain diseases, such as type 2 diabetes and high blood pressure. ?Heart rate and blood pressure. ?Body temperature. ?Skin for abnormal spots. ?What immunizations do I need? ?Vaccines are usually given at various ages, according to a schedule. Your health care provider will recommend vaccines for you based on your age, medical history, and lifestyle or other factors, such as travel or where you work. ?What tests do I need? ?Screening ?Your health care provider may recommend screening tests for certain conditions. This may include: ?Pelvic exam and Pap test. ?Lipid and cholesterol levels. ?Diabetes screening. This is done by checking your blood sugar (glucose) after you have not eaten for a while (fasting). ?Hepatitis B  test. ?Hepatitis C test. ?HIV (human immunodeficiency virus) test. ?STI (sexually transmitted infection) testing, if you are at risk. ?BRCA-related cancer screening. This may be done if you have a family history of breast, ovarian, tubal, or peritoneal cancers. ?Talk with your health care provider about your test results, treatment options, and if necessary, the need for more tests. ?Follow these instructions at home: ?Eating and drinking ? ?Eat a healthy diet that includes fresh fruits and vegetables, whole grains, lean protein, and low-fat dairy products. ?Take vitamin and mineral supplements as recommended by your health care provider. ?Do not drink alcohol if: ?Your health care provider tells you not to drink. ?You are pregnant, may be pregnant, or are planning to become pregnant. ?If you drink alcohol: ?Limit how much you have to 0-1 drink a day. ?Know how much alcohol is in your drink. In the [place] one drink equals one 12 oz bottle of beer (355 mL), one 5 oz glass of wine (148 mL), or one 1? oz glass of hard liquor (44 mL). ?Lifestyle ?Brush your teeth every morning and night with fluoride toothpaste. Floss one time each day. ?Exercise for at least 30 minutes 5 or more days each week. ?Do not use any products that contain nicotine or tobacco. These products include cigarettes, chewing tobacco, and vaping devices, such as e-cigarettes. If you need help quitting, ask your health care provider. ?Do not use drugs. ?If you are sexually active, practice safe sex. Use a condom or other form of protection to prevent STIs. ?If you do not wish to become pregnant, use a form of birth control. If you plan to become pregnant, see your health care provider for a prepregnancy visit. ?Find healthy ways to manage stress, such as: ?Meditation, yoga,  or listening to music. ?Journaling. ?Talking to a trusted person. ?Spending time with friends and family. ?Minimize exposure to UV radiation to reduce your risk of skin  cancer. ?Safety ?Always wear your seat belt while driving or riding in a vehicle. ?Do not drive: ?If you have been drinking alcohol. Do not ride with someone who has been drinking. ?If you have been using any mind-altering substances or drugs. ?While texting. ?When you are tired or distracted. ?Wear a helmet and other protective equipment during sports activities. ?If you have firearms in your house, make sure you follow all gun safety procedures. ?Seek help if you have been physically or sexually abused. ?What's next? ?Go to your health care provider once a year for an annual wellness visit. ?Ask your health care provider how often you should have your eyes and teeth checked. ?Stay up to date on all vaccines. ?This information is not intended to replace advice given to you by your health care provider. Make sure you discuss any questions you have with your health care provider. ?Document Revised: 01/27/2021 Document Reviewed: 01/27/2021 ?Elsevier Patient Education ? [place]. ? ?

## 2021-10-28 NOTE — Progress Notes (Signed)
Name: Cynthia Villa   MRN: [id number]    DOB: [birth year]   Date:10/29/2021 ? ?     Progress Note ? ?Subjective ? ?Chief Complaint ? ?Annual Exam ? ?HPI ? ?Patient presents for annual CPE. ? ?Diet: avoids meat due to alpha gal  ?Exercise: continue regular physical activity   ? ?[place] Office Visit from 10/29/2021 in [facility]  ?AUDIT-C Score 0  ? ?  ? ?Depression: Phq 9 is  negative ?Depression screen [facility] 2/9 10/29/2021 10/23/2020 05/04/2018 11/01/2017 06/23/2017  ?Decreased Interest 0 0 0 0 0  ?Down, Depressed, Hopeless 0 0 0 0 0  ?PHQ - 2 Score 0 0 0 0 0  ?Altered sleeping 0 0 0 - -  ?Tired, decreased energy 0 1 1 - -  ?Change in appetite 0 0 0 - -  ?Feeling bad or failure about yourself  0 0 0 - -  ?Trouble concentrating 0 0 0 - -  ?Moving slowly or fidgety/restless 0 0 0 - -  ?Suicidal thoughts 0 0 0 - -  ?PHQ-9 Score 0 1 1 - -  ?Difficult doing work/chores Not difficult at all Not difficult at all Not difficult at all - -  ? ?Hypertension: ?BP Readings from Last 3 Encounters:  ?10/29/21 110/68  ?07/21/21 119/83  ?02/16/21 114/89  ? ?Obesity: ?Wt Readings from Last 3 Encounters:  ?10/29/21 199 lb 8 oz (90.5 kg)  ?10/23/20 203 lb 14.4 oz (92.5 kg)  ?08/16/18 212 lb 4.8 oz (96.3 kg)  ? ?BMI Readings from Last 3 Encounters:  ?10/29/21 37.70 kg/m?  ?10/23/20 37.29 kg/m?  ?08/16/18 38.83 kg/m?  ?  ? ?Vaccines:  ? ?HPV: N/A ?Tdap: up to date 03/22  ?Shingrix: N/A ?Pneumonia: N/A ?Flu: declined  ?COVID-19: only had one vaccine, she is not sure if she will go back due to palpitation  ? ? ?Hep C Screening: 10/23/20 ?STD testing and prevention (HIV/chl/gon/syphilis): 03/04/15 ?Intimate partner violence: negative ?Sexual History : no problems, same partner  ?Menstrual History/LMP/Abnormal Bleeding: regular cycles , lasts 5-7 days not as heavy as it used to be  ?Discussed importance of follow up if any post-menopausal bleeding: not applicable ?Incontinence Symptoms: No. ? ?Breast cancer:  ?- Last Mammogram:  she is due for repeat study  ?- BRCA gene screening: N/A  ? ?Osteoporosis Prevention : Discussed high calcium and vitamin D supplementation, weight bearing exercises ?Bone density :not applicable ? ?Cervical cancer screening: 11/01/17 ? ?Skin cancer: Discussed monitoring for atypical lesions  ?Colorectal cancer: N/A   ?Lung cancer:  Low Dose CT Chest recommended if Age 36-80 years, 20 pack-year currently smoking OR have quit w/in 15years. Patient does not qualify.   ?ECG: N/A ? ?Advanced Care Planning: A voluntary discussion about advance care planning including the explanation and discussion of advance directives.  Discussed health care proxy and Living will, and the patient was able to identify a health care proxy as husband .  Patient does not  does not have a living will or power of attorney of health  ? ?Lipids: ?Lab Results  ?Component Value Date  ? CHOL 199 10/23/2020  ? CHOL 157 11/01/2017  ? CHOL 203 (H) 03/01/2016  ? ?Lab Results  ?Component Value Date  ? HDL 60 10/23/2020  ? HDL 50 (L) 11/01/2017  ? HDL 61 03/01/2016  ? ?Lab Results  ?Component Value Date  ? LDLCALC 112 (H) 10/23/2020  ? [place] 84 11/01/2017  ? [place] 117 03/01/2016  ? ?Lab Results  ?Component  Value Date  ? TRIG 159 (H) 10/23/2020  ? TRIG 135 11/01/2017  ? TRIG 125 03/01/2016  ? ?Lab Results  ?Component Value Date  ? CHOLHDL 3.3 10/23/2020  ? CHOLHDL 3.1 11/01/2017  ? CHOLHDL 3.3 03/01/2016  ? ?No results found for: LDLDIRECT ? ?Glucose: ?Glucose, Bld  ?Date Value Ref Range Status  ?10/23/2020 84 65 - 99 mg/dL Final  ?  Comment:  ?  . ?           Fasting reference interval ?. ?  ?05/04/2018 87 65 - 139 mg/dL Final  ?  Comment:  ?  . ?       Non-fasting reference interval ?. ?  ?11/01/2017 95 65 - 99 mg/dL Final  ?  Comment:  ?  . ?           Fasting reference interval ?. ?  ? ? ?Patient Active Problem List  ? Diagnosis Date Noted  ? Allergic reaction to alpha-gal 10/29/2021  ? Allergic rhinitis due to animal (cat) (dog) hair and dander  10/23/2020  ? Allergic rhinitis due to pollen 10/23/2020  ? COVID-19 long hauler 10/23/2020  ? Pre-diabetes 10/23/2020  ? Complication associated with orthopedic device ([place]) 06/05/2018  ? Sprain of wrist 06/05/2018  ? History of gestational diabetes 11/01/2017  ? Migraine headache without aura 08/30/2017  ? Urticaria 09/15/2016  ? Disorder of mitral valve 03/04/2015  ? History of thyroid nodule 03/04/2015  ? Allergic contact dermatitis 03/03/2015  ? Allergic to food 03/03/2015  ? History of hyperthyroidism 06/17/2011  ? Subclinical hypothyroidism 06/17/2011  ? ? ?Past Surgical History:  ?Procedure Laterality Date  ? WRIST FRACTURE SURGERY Left 09/08/09  ? Repaired- August 2004  ? ? ?Family History  ?Problem Relation Age of Onset  ? Diabetes Mother   ?     DM-2nd to chronic pancreatitis  ? Alcohol abuse Mother   ? Cancer Mother   ?     Breast  ? Cholelithiasis Mother   ? Lung cancer Mother   ?     Tobacco smoker  ? COPD Mother   ? Breast cancer Mother 52  ? Sleep apnea Father   ? Hypertension Father   ? [employer] Son   ? Polycystic ovary syndrome Sister   ? Cholelithiasis Sister   ? COPD Maternal Grandmother   ? Emphysema Maternal Grandmother   ?     Tobacco smoker  ? Prostate cancer Paternal Grandfather   ? Breast cancer Cousin   ?     late 51's  ? Cancer Paternal Aunt   ? Breast cancer Maternal Aunt 67  ? ? ?Social History  ? ?Socioeconomic History  ? Marital status: Married  ?  Spouse name: Montine Circle  ? Number of children: 5  ? Years of education: Not on file  ? Highest education level: Bachelor's degree (e.g., BA, AB, BS)  ?Occupational History  ? Not on file  ?Tobacco Use  ? Smoking status: Never  ? Smokeless tobacco: Never  ?Vaping Use  ? Vaping Use: Never used  ?Substance and Sexual Activity  ? Alcohol use: No  ?  Alcohol/week: 0.0 standard drinks  ? Drug use: No  ? Sexual activity: Yes  ?  Partners: Male  ?  Birth control/protection: I.U.D.  ?Other Topics Concern  ? Not on file  ?Social History Narrative  ? She is  married, 3 biological children and 2 adopted children   ? ?Social Determinants of Health  ? ?Financial Resource Strain:  Low Risk   ? Difficulty of Paying Living Expenses: Not hard at all  ?Food Insecurity: No Food Insecurity  ? Worried About Charity fundraiser in the Last Year: Never true  ? Ran Out of Food in the Last Year: Never true  ?Transportation Needs: No Transportation Needs  ? Lack of Transportation (Medical): No  ? Lack of Transportation (Non-Medical): No  ?Physical Activity: Sufficiently Active  ? Days of Exercise per Week: 5 days  ? Minutes of Exercise per Session: 50 min  ?Stress: No Stress Concern Present  ? Feeling of Stress : Only a little  ?Social Connections: Socially Integrated  ? Frequency of Communication with Friends and Family: More than three times a week  ? Frequency of Social Gatherings with Friends and Family: More than three times a week  ? Attends Religious Services: More than 4 times per year  ? Active Member of Clubs or Organizations: Yes  ? Attends Archivist Meetings: More than 4 times per year  ? Marital Status: Married  ?Intimate Partner Violence: Not At Risk  ? Fear of Current or Ex-Partner: No  ? Emotionally Abused: No  ? Physically Abused: No  ? Sexually Abused: No  ? ? ? ?Current Outpatient Medications:  ?  albuterol (PROVENTIL) (2.5 MG/3ML) 0.083% nebulizer solution, Take 3 mLs (2.5 mg total) by nebulization every 6 (six) hours as needed for wheezing or shortness of breath., Disp: 150 mL, Rfl: 1 ?  Azelastine-Fluticasone 137-50 MCG/ACT SUSP, SHAKE LIQUID AND USE 2 SPRAYS IN EACH NOSTRIL DAILY, Disp: 23 g, Rfl: 3 ?  EPINEPHrine 0.3 mg/0.3 mL IJ SOAJ injection, Inject into the muscle as directed., Disp: , Rfl:  ?  levocetirizine (XYZAL) 5 MG tablet, Take 1 tablet (5 mg total) by mouth daily., Disp: 30 tablet, Rfl: 3 ?  PARAGARD INTRAUTERINE COPPER IUD IUD, by Intrauterine route., Disp: , Rfl:  ?  tacrolimus (PROTOPIC) 0.1 % ointment, Apply topically 2 (two) times  daily., Disp: , Rfl:  ?  triamcinolone cream (KENALOG) 0.1 %, Apply 1 application topically 2 (two) times daily., Disp: 80 g, Rfl: 2 ? ?Allergies  ?Allergen Reactions  ? Cetirizine Other (See Comments)  ?  Oth

## 2021-10-29 ENCOUNTER — Encounter: Payer: Self-pay | Admitting: Family Medicine

## 2021-10-29 ENCOUNTER — Ambulatory Visit (INDEPENDENT_AMBULATORY_CARE_PROVIDER_SITE_OTHER): Payer: 59 | Admitting: Family Medicine

## 2021-10-29 VITALS — BP 110/68 | HR 94 | Temp 98.2°F | Resp 16 | Ht 61.0 in | Wt 199.5 lb

## 2021-10-29 DIAGNOSIS — Z0000 Encounter for general adult medical examination without abnormal findings: Secondary | ICD-10-CM

## 2021-10-29 DIAGNOSIS — Z13220 Encounter for screening for lipoid disorders: Secondary | ICD-10-CM

## 2021-10-29 DIAGNOSIS — Z8639 Personal history of other endocrine, nutritional and metabolic disease: Secondary | ICD-10-CM | POA: Diagnosis not present

## 2021-10-29 DIAGNOSIS — T7819XA Other adverse food reactions, not elsewhere classified, initial encounter: Secondary | ICD-10-CM

## 2021-10-29 DIAGNOSIS — R7303 Prediabetes: Secondary | ICD-10-CM

## 2021-10-29 DIAGNOSIS — T781XXA Other adverse food reactions, not elsewhere classified, initial encounter: Secondary | ICD-10-CM

## 2021-10-29 DIAGNOSIS — L209 Atopic dermatitis, unspecified: Secondary | ICD-10-CM

## 2021-10-29 DIAGNOSIS — N6320 Unspecified lump in the left breast, unspecified quadrant: Secondary | ICD-10-CM

## 2021-10-30 LAB — CBC WITH DIFFERENTIAL/PLATELET
Absolute Monocytes: 450 cells/uL (ref 200–950)
Basophils Absolute: 79 cells/uL (ref 0–200)
Basophils Relative: 1 %
Eosinophils Absolute: 229 cells/uL (ref 15–500)
Eosinophils Relative: 2.9 %
HCT: 41.7 % (ref 35.0–45.0)
Hemoglobin: 13.6 g/dL (ref 11.7–15.5)
Lymphs Abs: 2220 cells/uL (ref 850–3900)
MCH: 29.1 pg (ref 27.0–33.0)
MCHC: 32.6 g/dL (ref 32.0–36.0)
MCV: 89.1 fL (ref 80.0–100.0)
MPV: 11.3 fL (ref 7.5–12.5)
Monocytes Relative: 5.7 %
Neutro Abs: 4922 cells/uL (ref 1500–7800)
Neutrophils Relative %: 62.3 %
Platelets: 276 10*3/uL (ref 140–400)
RBC: 4.68 10*6/uL (ref 3.80–5.10)
RDW: 13.9 % (ref 11.0–15.0)
Total Lymphocyte: 28.1 %
WBC: 7.9 10*3/uL (ref 3.8–10.8)

## 2021-10-30 LAB — COMPLETE METABOLIC PANEL WITH GFR
AG Ratio: 1.6 (calc) (ref 1.0–2.5)
ALT: 15 U/L (ref 6–29)
AST: 19 U/L (ref 10–30)
Albumin: 4.1 g/dL (ref 3.6–5.1)
Alkaline phosphatase (APISO): 49 U/L (ref 31–125)
BUN/Creatinine Ratio: 10 (calc) (ref 6–22)
BUN: 11 mg/dL (ref 7–25)
CO2: 27 mmol/L (ref 20–32)
Calcium: 8.8 mg/dL (ref 8.6–10.2)
Chloride: 105 mmol/L (ref 98–110)
Creat: 1.05 mg/dL — ABNORMAL HIGH (ref 0.50–0.97)
Globulin: 2.6 g/dL (calc) (ref 1.9–3.7)
Glucose, Bld: 89 mg/dL (ref 65–99)
Potassium: 4.4 mmol/L (ref 3.5–5.3)
Sodium: 140 mmol/L (ref 135–146)
Total Bilirubin: 0.4 mg/dL (ref 0.2–1.2)
Total Protein: 6.7 g/dL (ref 6.1–8.1)
eGFR: 69 mL/min/{1.73_m2} (ref >=60)

## 2021-10-30 LAB — LIPID PANEL
Cholesterol: 184 mg/dL (ref <200)
HDL: 67 mg/dL (ref >=50)
LDL Cholesterol (Calc): 97 mg/dL (calc)
Non-HDL Cholesterol (Calc): 117 mg/dL (calc) (ref <130)
Total CHOL/HDL Ratio: 2.7 (calc) (ref <5.0)
Triglycerides: 100 mg/dL (ref <150)

## 2021-10-30 LAB — HEMOGLOBIN A1C
Hgb A1c MFr Bld: 6 % of total Hgb — ABNORMAL HIGH (ref <5.7)
Mean Plasma Glucose: 126 mg/dL
eAG (mmol/L): 7 mmol/L

## 2021-10-30 LAB — TSH: TSH: 1.48 mIU/L

## 2021-11-17 ENCOUNTER — Ambulatory Visit
Admission: RE | Admit: 2021-11-17 | Discharge: 2021-11-17 | Disposition: A | Discharge status: Home / Self Care | Payer: 59 | Source: Ambulatory Visit | Attending: Family Medicine | Admitting: Family Medicine

## 2021-11-17 DIAGNOSIS — N6320 Unspecified lump in the left breast, unspecified quadrant: Secondary | ICD-10-CM

## 2022-07-29 ENCOUNTER — Encounter: Payer: Self-pay | Admitting: Family Medicine

## 2022-08-02 ENCOUNTER — Ambulatory Visit (INDEPENDENT_AMBULATORY_CARE_PROVIDER_SITE_OTHER): Payer: 59 | Admitting: Podiatry

## 2022-08-02 ENCOUNTER — Ambulatory Visit (INDEPENDENT_AMBULATORY_CARE_PROVIDER_SITE_OTHER): Payer: 59

## 2022-08-02 DIAGNOSIS — M778 Other enthesopathies, not elsewhere classified: Secondary | ICD-10-CM | POA: Diagnosis not present

## 2022-08-02 DIAGNOSIS — M779 Enthesopathy, unspecified: Secondary | ICD-10-CM | POA: Diagnosis not present

## 2022-08-11 NOTE — Progress Notes (Signed)
Subjective:  Patient ID: Cynthia Villa, female    DOB: Jun 21, 1981,  MRN: [id number]  Chief Complaint  Patient presents with   Foot Pain    Patient is here for right foot pain, she states that she injured her right foot back in September 2023 and was seen at [facility], still having pain wants second opinion    41 y.o. female presents with the above complaint.  Patient presents with complaint of right foot pain that has been going on for quite some time.  She states she injured it a little while ago in September 2023 she was seen at [facility] was placed in a boot and has been being treated for a while now.  She still having pain she wanted a second opinion she has not seen anyone else prior to seeing me denies any other acute complaints hurts with ambulation worse with pressure pain scale 7 out of 10 sharp shooting in nature   Review of Systems: Negative except as noted in the HPI. Denies N/V/F/Ch.  Past Medical History:  Diagnosis Date   Allergic eczema    Allergy    Exposure to TB     Current Outpatient Medications:    albuterol (PROVENTIL) (2.5 MG/3ML) 0.083% nebulizer solution, Take 3 mLs (2.5 mg total) by nebulization every 6 (six) hours as needed for wheezing or shortness of breath., Disp: 150 mL, Rfl: 1   Azelastine-Fluticasone 137-50 MCG/ACT SUSP, SHAKE LIQUID AND USE 2 SPRAYS IN EACH NOSTRIL DAILY, Disp: 23 g, Rfl: 3   EPINEPHrine 0.3 mg/0.3 mL IJ SOAJ injection, Inject into the muscle as directed., Disp: , Rfl:    levocetirizine (XYZAL) 5 MG tablet, Take 1 tablet (5 mg total) by mouth daily., Disp: 30 tablet, Rfl: 3   PARAGARD INTRAUTERINE COPPER IUD IUD, by Intrauterine route., Disp: , Rfl:    tacrolimus (PROTOPIC) 0.1 % ointment, Apply topically 2 (two) times daily., Disp: , Rfl:    triamcinolone cream (KENALOG) 0.1 %, Apply 1 application topically 2 (two) times daily., Disp: 80 g, Rfl: 2  Social History   Tobacco Use  Smoking Status Never  Smokeless Tobacco Never     Allergies  Allergen Reactions   Cetirizine Other (See Comments)    Other Reaction: CNS Disorder   Dog Epithelium    Monistat  [Miconazole] Nausea And Vomiting   Orange Fruit [Citrus] Swelling   Penicillins Hives   Permethrin     rash   Sudafed  [Pseudoephedrine]     Other reaction(s): Muscle Pain   Banana Rash   Pineapple Rash   Objective:  There were no vitals filed for this visit. There is no height or weight on file to calculate BMI. Constitutional Well developed. Well nourished.  Vascular Dorsalis pedis pulses palpable bilaterally. Posterior tibial pulses palpable bilaterally. Capillary refill normal to all digits.  No cyanosis or clubbing noted. Pedal hair growth normal.  Neurologic Normal speech. Oriented to person, place, and time. Epicritic sensation to light touch grossly present bilaterally.  Dermatologic Nails well groomed and normal in appearance. No open wounds. No skin lesions.  Orthopedic: Pain on palpation to the right medial foot.  Pain with range of motion of the foot.  Pain consistent with capsulitis of the joint versus tendinitis.  Pain with ankle range of motion of the ankle joint no pain with other range of motion.   Radiographs: 3 views of skeletally mature the right foot: Pes planovalgus foot structure noted posterior and plantar heel spurring noted.  Midfoot arthritis  noted no other bony abnormalities noted no break noted. Assessment:   1. Tendonitis    Plan:  Patient was evaluated and treated and all questions answered.  Right foot capsulitis -All questions and concerns were discussed with the patient in extensive detail -Given the amount of pain that she is experiencing she will benefit from steroid injection help decrease acute inflammatory component associate with pain.  Patient agrees with plan like to proceed with steroid injection. -A steroid injection was performed at right medial foot at point of maximal tenderness using 1% plain  Lidocaine and 10 mg of Kenalog. This was well tolerated. -She has failed all other conservative treatment options including cam boot immobilization   No follow-ups on file.

## 2022-09-02 ENCOUNTER — Ambulatory Visit (INDEPENDENT_AMBULATORY_CARE_PROVIDER_SITE_OTHER): Payer: 59 | Admitting: Podiatry

## 2022-09-02 VITALS — BP 130/70

## 2022-09-02 DIAGNOSIS — M21962 Unspecified acquired deformity of left lower leg: Secondary | ICD-10-CM

## 2022-09-02 DIAGNOSIS — M21961 Unspecified acquired deformity of right lower leg: Secondary | ICD-10-CM | POA: Diagnosis not present

## 2022-09-02 DIAGNOSIS — M778 Other enthesopathies, not elsewhere classified: Secondary | ICD-10-CM

## 2022-09-02 NOTE — Progress Notes (Signed)
Subjective:  Patient ID: Cynthia Villa, female    DOB: July 04, 1981,  MRN: [id number]  Chief Complaint  Patient presents with   Foot Pain    Pt stated that she is doing better     42 y.o. female presents with the above complaint.  Patient presents for follow-up of right dorsal midfoot pain.  Patient states that injection helped considerably.  Her pain is essentially gone.  She would like to discuss prevention technique.   Review of Systems: Negative except as noted in the HPI. Denies N/V/F/Ch.  Past Medical History:  Diagnosis Date   Allergic eczema    Allergy    Exposure to TB     Current Outpatient Medications:    albuterol (PROVENTIL) (2.5 MG/3ML) 0.083% nebulizer solution, Take 3 mLs (2.5 mg total) by nebulization every 6 (six) hours as needed for wheezing or shortness of breath., Disp: 150 mL, Rfl: 1   Azelastine-Fluticasone 137-50 MCG/ACT SUSP, SHAKE LIQUID AND USE 2 SPRAYS IN EACH NOSTRIL DAILY, Disp: 23 g, Rfl: 3   EPINEPHrine 0.3 mg/0.3 mL IJ SOAJ injection, Inject into the muscle as directed., Disp: , Rfl:    levocetirizine (XYZAL) 5 MG tablet, Take 1 tablet (5 mg total) by mouth daily., Disp: 30 tablet, Rfl: 3   PARAGARD INTRAUTERINE COPPER IUD IUD, by Intrauterine route., Disp: , Rfl:    tacrolimus (PROTOPIC) 0.1 % ointment, Apply topically 2 (two) times daily., Disp: , Rfl:    triamcinolone cream (KENALOG) 0.1 %, Apply 1 application topically 2 (two) times daily., Disp: 80 g, Rfl: 2  Social History   Tobacco Use  Smoking Status Never  Smokeless Tobacco Never    Allergies  Allergen Reactions   Cetirizine Other (See Comments)    Other Reaction: CNS Disorder   Dog Epithelium    Monistat  [Miconazole] Nausea And Vomiting   Orange Fruit [Citrus] Swelling   Penicillins Hives   Permethrin     rash   Sudafed  [Pseudoephedrine]     Other reaction(s): Muscle Pain   Banana Rash   Pineapple Rash   Objective:   Vitals:   09/02/22 1010  BP: 130/70   There is no  height or weight on file to calculate BMI. Constitutional Well developed. Well nourished.  Vascular Dorsalis pedis pulses palpable bilaterally. Posterior tibial pulses palpable bilaterally. Capillary refill normal to all digits.  No cyanosis or clubbing noted. Pedal hair growth normal.  Neurologic Normal speech. Oriented to person, place, and time. Epicritic sensation to light touch grossly present bilaterally.  Dermatologic Nails well groomed and normal in appearance. No open wounds. No skin lesions.  Orthopedic: No further pain on palpation to the right medial foot.  No further pain with range of motion of the foot.  No further pain consistent with capsulitis of the joint versus tendinitis.  No further pain with ankle range of motion of the ankle joint no pain with other range of motion.  Pes planovalgus foot structure noted with calcaneovalgus to many toe signs able to recruit the arch with dorsiflexion of the hallux able to perform single and double heel raise with return of calcaneus to neutral position   Radiographs: 3 views of skeletally mature the right foot: Pes planovalgus foot structure noted posterior and plantar heel spurring noted.  Midfoot arthritis noted no other bony abnormalities noted no break noted. Assessment:   1. Capsulitis of right foot   2. Acquired deformity of right foot   3. Acquired deformity of left foot  Plan:  Patient was evaluated and treated and all questions answered.  Right foot midfoot capsulitis -All questions and concerns were discussed with the patient in extensive detail -Clinically improved and completely resolved.  At this time I discussed shoe gear modification padding protecting offloading.  She would also benefit from orthotics management.  Pes planovalgus/Foot deformity -I explained to patient the etiology of pes planovalgus and relationship with deformity and various treatment options were discussed.  Given patient foot structure in  the setting of bunion deformity I believe patient will benefit from custom-made orthotics to help control the hindfoot motion support the arch of the foot and take the stress away from slow down the progression of the bunion patient agrees with the plan like to proceed with orthotics -Patient was casted for orthotics    No follow-ups on file.

## 2022-10-07 ENCOUNTER — Telehealth: Payer: Self-pay | Admitting: Podiatry

## 2022-10-07 NOTE — Telephone Encounter (Signed)
No answer no vm , tried to reach pt for her orthotics  Balance is 131.88

## 2022-10-27 NOTE — Patient Instructions (Signed)
Preventive Care 40-41 Years Old, Female Preventive care refers to lifestyle choices and visits with your health care provider that can promote health and wellness. Preventive care visits are also called wellness exams. What can I expect for my preventive care visit? Counseling Your health care provider may ask you questions about your: Medical history, including: Past medical problems. Family medical history. Pregnancy history. Current health, including: Menstrual cycle. Method of birth control. Emotional well-being. Home life and relationship well-being. Sexual activity and sexual health. Lifestyle, including: Alcohol, nicotine or tobacco, and drug use. Access to firearms. Diet, exercise, and sleep habits. Work and work environment. Sunscreen use. Safety issues such as seatbelt and bike helmet use. Physical exam Your health care provider will check your: Height and weight. These may be used to calculate your BMI (body mass index). BMI is a measurement that tells if you are at a healthy weight. Waist circumference. This measures the distance around your waistline. This measurement also tells if you are at a healthy weight and may help predict your risk of certain diseases, such as type 2 diabetes and high blood pressure. Heart rate and blood pressure. Body temperature. Skin for abnormal spots. What immunizations do I need?  Vaccines are usually given at various ages, according to a schedule. Your health care provider will recommend vaccines for you based on your age, medical history, and lifestyle or other factors, such as travel or where you work. What tests do I need? Screening Your health care provider may recommend screening tests for certain conditions. This may include: Lipid and cholesterol levels. Diabetes screening. This is done by checking your blood sugar (glucose) after you have not eaten for a while (fasting). Pelvic exam and Pap test. Hepatitis B test. Hepatitis C  test. HIV (human immunodeficiency virus) test. STI (sexually transmitted infection) testing, if you are at risk. Lung cancer screening. Colorectal cancer screening. Mammogram. Talk with your health care provider about when you should start having regular mammograms. This may depend on whether you have a family history of breast cancer. BRCA-related cancer screening. This may be done if you have a family history of breast, ovarian, tubal, or peritoneal cancers. Bone density scan. This is done to screen for osteoporosis. Talk with your health care provider about your test results, treatment options, and if necessary, the need for more tests. Follow these instructions at home: Eating and drinking  Eat a diet that includes fresh fruits and vegetables, whole grains, lean protein, and low-fat dairy products. Take vitamin and mineral supplements as recommended by your health care provider. Do not drink alcohol if: Your health care provider tells you not to drink. You are pregnant, may be pregnant, or are planning to become pregnant. If you drink alcohol: Limit how much you have to 0-1 drink a day. Know how much alcohol is in your drink. In the [place] one drink equals one 12 oz bottle of beer (355 mL), one 5 oz glass of wine (148 mL), or one 1 oz glass of hard liquor (44 mL). Lifestyle Brush your teeth every morning and night with fluoride toothpaste. Floss one time each day. Exercise for at least 30 minutes 5 or more days each week. Do not use any products that contain nicotine or tobacco. These products include cigarettes, chewing tobacco, and vaping devices, such as e-cigarettes. If you need help quitting, ask your health care provider. Do not use drugs. If you are sexually active, practice safe sex. Use a condom or other form of protection to   prevent STIs. If you do not wish to become pregnant, use a form of birth control. If you plan to become pregnant, see your health care provider for a  prepregnancy visit. Take aspirin only as told by your health care provider. Make sure that you understand how much to take and what form to take. Work with your health care provider to find out whether it is safe and beneficial for you to take aspirin daily. Find healthy ways to manage stress, such as: Meditation, yoga, or listening to music. Journaling. Talking to a trusted person. Spending time with friends and family. Minimize exposure to UV radiation to reduce your risk of skin cancer. Safety Always wear your seat belt while driving or riding in a vehicle. Do not drive: If you have been drinking alcohol. Do not ride with someone who has been drinking. When you are tired or distracted. While texting. If you have been using any mind-altering substances or drugs. Wear a helmet and other protective equipment during sports activities. If you have firearms in your house, make sure you follow all gun safety procedures. Seek help if you have been physically or sexually abused. What's next? Visit your health care provider once a year for an annual wellness visit. Ask your health care provider how often you should have your eyes and teeth checked. Stay up to date on all vaccines. This information is not intended to replace advice given to you by your health care provider. Make sure you discuss any questions you have with your health care provider. Document Revised: 01/27/2021 Document Reviewed: 01/27/2021 Elsevier Patient Education  2023 Elsevier Inc.  

## 2022-10-27 NOTE — Progress Notes (Signed)
Name: Cynthia Villa   MRN: [id number]    DOB: 17-Mar-1981   Date:10/28/2022       Progress Note  Subjective  Chief Complaint  Annual Exam  HPI  Patient presents for annual CPE.  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, unable to eat gluten or gelatin, vegan diet now  Exercise: continue regular physical activity   Last Eye Exam: up to date - contact lenses  Last Dental Exam: up to date   [facility] Visit from 10/28/2022 in [facility]  AUDIT-C Score 0      Depression: Phq 9 is  negative    10/28/2022    9:11 AM 10/29/2021   10:02 AM 10/23/2020    3:35 PM 05/04/2018   12:28 PM 11/01/2017   10:41 AM  Depression screen PHQ 2/9  Decreased Interest 0 0 0 0 0  Down, Depressed, Hopeless 0 0 0 0 0  PHQ - 2 Score 0 0 0 0 0  Altered sleeping 0 0 0 0   Tired, decreased energy 0 0 1 1   Change in appetite 0 0 0 0   Feeling bad or failure about yourself  0 0 0 0   Trouble concentrating 0 0 0 0   Moving slowly or fidgety/restless 0 0 0 0   Suicidal thoughts 0 0 0 0   PHQ-9 Score 0 0 1 1   Difficult doing work/chores  Not difficult at all Not difficult at all Not difficult at all    Hypertension: BP Readings from Last 3 Encounters:  10/28/22 126/70  09/02/22 130/70  10/29/21 110/68   Obesity: Wt Readings from Last 3 Encounters:  10/28/22 202 lb 12.8 oz (92 kg)  10/29/21 199 lb 8 oz (90.5 kg)  10/23/20 203 lb 14.4 oz (92.5 kg)   BMI Readings from Last 3 Encounters:  10/28/22 38.32 kg/m  10/29/21 37.70 kg/m  10/23/20 37.29 kg/m     Vaccines:   HPV: N/A Tdap: up to date Shingrix: N/A Pneumonia: N/A Flu: 2022, not interested  COVID-19: up to date   Hep C Screening: 10/23/20 STD testing and prevention (HIV/chl/gon/syphilis): 03/04/15 Intimate partner violence: negative screen  Sexual History : one partner, no problems, good libido  Menstrual History/LMP/Abnormal Bleeding: regular cycles, has Parguard  Discussed importance of  follow up if any post-menopausal bleeding: no  Incontinence Symptoms: negative for symptoms   Breast cancer:  - Last Mammogram: due for repeat  - BRCA gene screening: mother and maternal aunt and maternal cousin - discussed genetic test She states mother's test was negative .   Osteoporosis Prevention : Discussed high calcium and vitamin D supplementation, weight bearing exercises Bone density: N/A   Cervical cancer screening: 11/01/17, due- ordered today  Skin cancer: Discussed monitoring for atypical lesions - reminded her to schedule a visit  Colorectal cancer: N/A   Lung cancer:  Low Dose CT Chest recommended if Age 10-80 years, 20 pack-year currently smoking OR have quit w/in 15years. Patient does not qualify for screen   ECG: N/A  Advanced Care Planning: A voluntary discussion about advance care planning including the explanation and discussion of advance directives.  Discussed health care proxy and Living will, and the patient was able to identify a health care proxy as husband .  Patient does not have a living will and power of attorney of health care   Lipids: Lab Results  Component Value Date   CHOL 184 10/29/2021   CHOL  199 10/23/2020   CHOL 157 11/01/2017   Lab Results  Component Value Date   HDL 67 10/29/2021   HDL 60 10/23/2020   HDL 50 (L) 11/01/2017   Lab Results  Component Value Date   LDLCALC 97 10/29/2021   LDLCALC 112 (H) 10/23/2020   LDLCALC 84 11/01/2017   Lab Results  Component Value Date   TRIG 100 10/29/2021   TRIG 159 (H) 10/23/2020   TRIG 135 11/01/2017   Lab Results  Component Value Date   CHOLHDL 2.7 10/29/2021   CHOLHDL 3.3 10/23/2020   CHOLHDL 3.1 11/01/2017   No results found for: "LDLDIRECT"  Glucose: Glucose, Bld  Date Value Ref Range Status  10/29/2021 89 65 - 99 mg/dL Final    Comment:    .            Fasting reference interval .   10/23/2020 84 65 - 99 mg/dL Final    Comment:    .            Fasting reference  interval .   05/04/2018 87 65 - 139 mg/dL Final    Comment:    .        Non-fasting reference interval .     Patient Active Problem List   Diagnosis Date Noted   Allergic reaction to alpha-gal 10/29/2021   Allergic rhinitis due to animal (cat) (dog) hair and dander 10/23/2020   Allergic rhinitis due to pollen 10/23/2020   COVID-19 long hauler 10/23/2020   Pre-diabetes 10/23/2020   History of gestational diabetes 11/01/2017   Migraine headache without aura 08/30/2017   Urticaria 09/15/2016   Disorder of mitral valve 03/04/2015   History of thyroid nodule 03/04/2015   Allergic contact dermatitis 03/03/2015   Allergic to food 03/03/2015   History of hyperthyroidism 06/17/2011   Subclinical hypothyroidism 06/17/2011    Past Surgical History:  Procedure Laterality Date   WRIST FRACTURE SURGERY Left 09/08/09   Repaired- August 2004    Family History  Problem Relation Age of Onset   Diabetes Mother        DM-2nd to chronic pancreatitis   Alcohol abuse Mother    Cancer Mother        Breast   Cholelithiasis Mother    Lung cancer Mother        Tobacco smoker   COPD Mother    Breast cancer Mother 76   Sleep apnea Father    Hypertension Father    PKU Son    Polycystic ovary syndrome Sister    Cholelithiasis Sister    COPD Maternal Grandmother    Emphysema Maternal Grandmother        Tobacco smoker   Prostate cancer Paternal Grandfather    Breast cancer Cousin        late 30's   Cancer Paternal Aunt    Breast cancer Maternal Aunt 50    Social History   Socioeconomic History   Marital status: Married    Spouse name: Montine Circle   Number of children: 5   Years of education: Not on file   Highest education level: Bachelor's degree (e.g., BA, AB, BS)  Occupational History   Not on file  Tobacco Use   Smoking status: Never   Smokeless tobacco: Never  Vaping Use   Vaping Use: Never used  Substance and Sexual Activity   Alcohol use: No    Alcohol/week: 0.0  standard drinks of alcohol   Drug use: No   Sexual activity: Yes  Partners: Male    Birth control/protection: I.U.D.  Other Topics Concern   Not on file  Social History Narrative   She is married, 3 biological children and 2 adopted children    Social Determinants of Health   Financial Resource Strain: Low Risk  (10/28/2022)   Overall Financial Resource Strain (CARDIA)    Difficulty of Paying Living Expenses: Not hard at all  Food Insecurity: No Food Insecurity (10/28/2022)   Hunger Vital Sign    Worried About Running Out of Food in the Last Year: Never true    Ran Out of Food in the Last Year: Never true  Transportation Needs: No Transportation Needs (10/28/2022)   PRAPARE - Hydrologist (Medical): No    Lack of Transportation (Non-Medical): No  Physical Activity: Sufficiently Active (10/28/2022)   Exercise Vital Sign    Days of Exercise per Week: 4 days    Minutes of Exercise per Session: 40 min  Stress: No Stress Concern Present (10/28/2022)   [place]    Feeling of Stress : Not at all  Social Connections: Miner (10/28/2022)   Social Connection and Isolation Panel [NHANES]    Frequency of Communication with Friends and Family: More than three times a week    Frequency of Social Gatherings with Friends and Family: More than three times a week    Attends Religious Services: More than 4 times per year    Active Member of [employer] or Organizations: Yes    Attends Music therapist: More than 4 times per year    Marital Status: Married  Human resources officer Violence: Not At Risk (10/28/2022)   Humiliation, Afraid, Rape, and Kick questionnaire    Fear of Current or Ex-Partner: No    Emotionally Abused: No    Physically Abused: No    Sexually Abused: No     Current Outpatient Medications:    albuterol (PROVENTIL) (2.5 MG/3ML) 0.083% nebulizer solution, Take 3 mLs  (2.5 mg total) by nebulization every 6 (six) hours as needed for wheezing or shortness of breath., Disp: 150 mL, Rfl: 1   Azelastine-Fluticasone 137-50 MCG/ACT SUSP, SHAKE LIQUID AND USE 2 SPRAYS IN EACH NOSTRIL DAILY, Disp: 23 g, Rfl: 3   EPINEPHrine 0.3 mg/0.3 mL IJ SOAJ injection, Inject into the muscle as directed., Disp: , Rfl:    levocetirizine (XYZAL) 5 MG tablet, Take 1 tablet (5 mg total) by mouth daily., Disp: 30 tablet, Rfl: 3   PARAGARD INTRAUTERINE COPPER IUD IUD, by Intrauterine route., Disp: , Rfl:    tacrolimus (PROTOPIC) 0.1 % ointment, Apply topically 2 (two) times daily., Disp: , Rfl:    triamcinolone cream (KENALOG) 0.1 %, Apply 1 application topically 2 (two) times daily., Disp: 80 g, Rfl: 2  Allergies  Allergen Reactions   Cetirizine Other (See Comments)    Other Reaction: CNS Disorder   Dog Epithelium    Monistat  [Miconazole] Nausea And Vomiting   Orange Fruit [Citrus] Swelling   Penicillins Hives   Permethrin     rash   Sudafed  [Pseudoephedrine]     Other reaction(s): Muscle Pain   Banana Rash   Pineapple Rash     ROS  Constitutional: Negative for fever or weight change.  Respiratory: Negative for cough and shortness of breath.   Cardiovascular: Negative for chest pain or palpitations.  Gastrointestinal: Negative for abdominal pain, no bowel changes.  Musculoskeletal: Negative for gait problem or joint  swelling.  Skin: positive  for rash.  Neurological: Negative for dizziness or headache.  No other specific complaints in a complete review of systems (except as listed in HPI above).   Objective  Vitals:   10/28/22 0909  BP: 126/70  Pulse: 90  Resp: 14  Temp: 98.1 F (36.7 C)  TempSrc: Oral  SpO2: 98%  Weight: 202 lb 12.8 oz (92 kg)  Height: 5\' 1"  (1.549 m)    Body mass index is 38.32 kg/m.  Physical Exam  Constitutional: Patient appears well-developed and well-nourished. Obesity  No distress.  HENT: Head: Normocephalic and atraumatic.  Ears: B TMs ok, no erythema or effusion; Nose: Nose normal. Mouth/Throat: Oropharynx is clear and moist. No oropharyngeal exudate.  Eyes: Conjunctivae and EOM are normal. Pupils are equal, round, and reactive to light. No scleral icterus.  Neck: Normal range of motion. Neck supple. No JVD present. No thyromegaly present.  Cardiovascular: Normal rate, regular rhythm and normal heart sounds.  No murmur heard. No BLE edema. Pulmonary/Chest: Effort normal and breath sounds normal. No respiratory distress. Abdominal: Soft. Bowel sounds are normal, no distension. There is no tenderness. no masses Breast: no lumps or masses, no nipple discharge or rashes FEMALE GENITALIA:  External genitalia normal External urethra normal Vaginal vault normal without discharge or lesions Cervix normal without discharge or lesions IUD strings in place  Bimanual exam normal without masses RECTAL: not done  Musculoskeletal: Normal range of motion, no joint effusions. No gross deformities Neurological: he is alert and oriented to person, place, and time. No cranial nerve deficit. Coordination, balance, strength, speech and gait are normal.  Skin: Skin is warm and dry. No rash noted. No erythema.  Psychiatric: Patient has a normal mood and affect. behavior is normal. Judgment and thought content normal.    Fall Risk:    10/28/2022    9:12 AM 10/29/2021   10:02 AM 10/23/2020    3:35 PM 08/16/2018    1:23 PM 07/20/2018   11:39 AM  Fall Risk   Falls in the past year? 0 0 0 0 0  Number falls in past yr:  0 0 0   Injury with Fall?  0  0   Risk for fall due to : No Fall Risks No Fall Risks     Follow up Falls prevention discussed;Education provided;Falls evaluation completed Falls prevention discussed Falls prevention discussed       Functional Status Survey: Is the patient deaf or have difficulty hearing?: No Does the patient have difficulty seeing, even when wearing glasses/contacts?: No Does the patient have  difficulty concentrating, remembering, or making decisions?: No Does the patient have difficulty walking or climbing stairs?: No Does the patient have difficulty dressing or bathing?: No Does the patient have difficulty doing errands alone such as visiting a doctor's office or shopping?: No   Assessment & Plan  1. Well adult exam  - Cytology - PAP - Lipid panel - CBC with Differential/Platelet - COMPLETE METABOLIC PANEL WITH GFR - Hemoglobin A1c - TSH - B12 and Folate Panel  2. Cervical cancer screening  - Cytology - PAP  3. Need for influenza vaccination  Refused   4. History of hyperthyroidism  - TSH  5. Pre-diabetes  - Hemoglobin A1c  6. Lipid screening  - Lipid panel  7. Long-term use of high-risk medication  - CBC with Differential/Platelet - COMPLETE METABOLIC PANEL WITH GFR  8. Mass of left breast, unspecified quadrant  - MM Digital Diagnostic Bilat; Future -  [place] LIMITED ULTRASOUND INCLUDING AXILLA LEFT BREAST ; Future - [place] LIMITED ULTRASOUND INCLUDING AXILLA RIGHT BREAST; Future - Ambulatory referral to General Surgery  9. Allergic reaction to alpha-gal  - B12 and Folate Panel  10. Vegan diet  - B12 and Folate Panel  11. Family history of breast cancer in female  - Ambulatory referral to General Surgery    -USPSTF grade A and B recommendations reviewed with patient; age-appropriate recommendations, preventive care, screening tests, etc discussed and encouraged; healthy living encouraged; see AVS for patient education given to patient -Discussed importance of 150 minutes of physical activity weekly, eat two servings of fish weekly, eat one serving of tree nuts ( cashews, pistachios, pecans, almonds.Marland Kitchen) every other day, eat 6 servings of fruit/vegetables daily and drink plenty of water and avoid sweet beverages.   -Reviewed Health Maintenance: Yes.

## 2022-10-28 ENCOUNTER — Ambulatory Visit (INDEPENDENT_AMBULATORY_CARE_PROVIDER_SITE_OTHER): Payer: 59 | Admitting: Family Medicine

## 2022-10-28 ENCOUNTER — Encounter: Payer: Self-pay | Admitting: Family Medicine

## 2022-10-28 ENCOUNTER — Other Ambulatory Visit (HOSPITAL_COMMUNITY)
Admission: RE | Admit: 2022-10-28 | Discharge: 2022-10-28 | Disposition: A | Discharge status: Home / Self Care | Payer: 59 | Source: Ambulatory Visit | Attending: Family Medicine | Admitting: Family Medicine

## 2022-10-28 VITALS — BP 126/70 | HR 90 | Temp 98.1°F | Resp 14 | Ht 61.0 in | Wt 202.8 lb

## 2022-10-28 DIAGNOSIS — Z13220 Encounter for screening for lipoid disorders: Secondary | ICD-10-CM

## 2022-10-28 DIAGNOSIS — R7303 Prediabetes: Secondary | ICD-10-CM | POA: Diagnosis not present

## 2022-10-28 DIAGNOSIS — Z124 Encounter for screening for malignant neoplasm of cervix: Secondary | ICD-10-CM | POA: Diagnosis present

## 2022-10-28 DIAGNOSIS — Z23 Encounter for immunization: Secondary | ICD-10-CM

## 2022-10-28 DIAGNOSIS — Z803 Family history of malignant neoplasm of breast: Secondary | ICD-10-CM

## 2022-10-28 DIAGNOSIS — Z8639 Personal history of other endocrine, nutritional and metabolic disease: Secondary | ICD-10-CM

## 2022-10-28 DIAGNOSIS — Z0000 Encounter for general adult medical examination without abnormal findings: Secondary | ICD-10-CM | POA: Diagnosis present

## 2022-10-28 DIAGNOSIS — N6320 Unspecified lump in the left breast, unspecified quadrant: Secondary | ICD-10-CM

## 2022-10-28 DIAGNOSIS — T781XXA Other adverse food reactions, not elsewhere classified, initial encounter: Secondary | ICD-10-CM

## 2022-10-28 DIAGNOSIS — Z79899 Other long term (current) drug therapy: Secondary | ICD-10-CM

## 2022-10-28 DIAGNOSIS — Z789 Other specified health status: Secondary | ICD-10-CM

## 2022-10-29 LAB — COMPLETE METABOLIC PANEL WITH GFR
AG Ratio: 1.5 (calc) (ref 1.0–2.5)
ALT: 14 U/L (ref 6–29)
AST: 17 U/L (ref 10–30)
Albumin: 4 g/dL (ref 3.6–5.1)
Alkaline phosphatase (APISO): 51 U/L (ref 31–125)
BUN: 9 mg/dL (ref 7–25)
CO2: 24 mmol/L (ref 20–32)
Calcium: 8.7 mg/dL (ref 8.6–10.2)
Chloride: 108 mmol/L (ref 98–110)
Creat: 0.89 mg/dL (ref 0.50–0.99)
Globulin: 2.7 g/dL (calc) (ref 1.9–3.7)
Glucose, Bld: 92 mg/dL (ref 65–99)
Potassium: 4.4 mmol/L (ref 3.5–5.3)
Sodium: 142 mmol/L (ref 135–146)
Total Bilirubin: 0.5 mg/dL (ref 0.2–1.2)
Total Protein: 6.7 g/dL (ref 6.1–8.1)
eGFR: 84 mL/min/{1.73_m2} (ref >=60)

## 2022-10-29 LAB — CBC WITH DIFFERENTIAL/PLATELET
Absolute Monocytes: 456 cells/uL (ref 200–950)
Basophils Absolute: 88 cells/uL (ref 0–200)
Basophils Relative: 1.3 %
Eosinophils Absolute: 218 cells/uL (ref 15–500)
Eosinophils Relative: 3.2 %
HCT: 40.7 % (ref 35.0–45.0)
Hemoglobin: 13.5 g/dL (ref 11.7–15.5)
Lymphs Abs: 1836 cells/uL (ref 850–3900)
MCH: 29.5 pg (ref 27.0–33.0)
MCHC: 33.2 g/dL (ref 32.0–36.0)
MCV: 88.9 fL (ref 80.0–100.0)
MPV: 11.6 fL (ref 7.5–12.5)
Monocytes Relative: 6.7 %
Neutro Abs: 4202 cells/uL (ref 1500–7800)
Neutrophils Relative %: 61.8 %
Platelets: 235 10*3/uL (ref 140–400)
RBC: 4.58 10*6/uL (ref 3.80–5.10)
RDW: 13.7 % (ref 11.0–15.0)
Total Lymphocyte: 27 %
WBC: 6.8 10*3/uL (ref 3.8–10.8)

## 2022-10-29 LAB — LIPID PANEL
Cholesterol: 130 mg/dL (ref <200)
HDL: 57 mg/dL (ref >=50)
LDL Cholesterol (Calc): 60 mg/dL (calc)
Non-HDL Cholesterol (Calc): 73 mg/dL (calc) (ref <130)
Total CHOL/HDL Ratio: 2.3 (calc) (ref <5.0)
Triglycerides: 52 mg/dL (ref <150)

## 2022-10-29 LAB — HEMOGLOBIN A1C
Hgb A1c MFr Bld: 5.9 % of total Hgb — ABNORMAL HIGH (ref <5.7)
Mean Plasma Glucose: 123 mg/dL
eAG (mmol/L): 6.8 mmol/L

## 2022-10-29 LAB — B12 AND FOLATE PANEL
Folate: 11.5 ng/mL
Vitamin B-12: 350 pg/mL (ref 200–1100)

## 2022-10-29 LAB — TSH: TSH: 0.64 mIU/L

## 2022-11-02 LAB — CYTOLOGY - PAP
Comment: NEGATIVE
Diagnosis: NEGATIVE
High risk HPV: NEGATIVE

## 2022-11-17 ENCOUNTER — Ambulatory Visit (INDEPENDENT_AMBULATORY_CARE_PROVIDER_SITE_OTHER): Payer: 59 | Admitting: *Deleted

## 2022-11-17 DIAGNOSIS — M778 Other enthesopathies, not elsewhere classified: Secondary | ICD-10-CM

## 2022-11-17 NOTE — Progress Notes (Signed)
Patient presents today to pick up custom molded foot orthotics recommended by Dr. PATEL.   Orthotics were dispensed and fit was satisfactory. Reviewed instructions for break-in and wear. Written instructions given to patient.  Patient will follow up as needed.   

## 2022-11-21 ENCOUNTER — Other Ambulatory Visit: Payer: 59

## 2022-11-22 ENCOUNTER — Ambulatory Visit
Admission: RE | Admit: 2022-11-22 | Discharge: 2022-11-22 | Disposition: A | Discharge status: Home / Self Care | Payer: 59 | Source: Ambulatory Visit | Attending: Family Medicine | Admitting: Family Medicine

## 2022-11-22 DIAGNOSIS — N6320 Unspecified lump in the left breast, unspecified quadrant: Secondary | ICD-10-CM | POA: Diagnosis present

## 2022-11-23 ENCOUNTER — Encounter: Payer: Self-pay | Admitting: Surgery

## 2022-11-23 ENCOUNTER — Ambulatory Visit (INDEPENDENT_AMBULATORY_CARE_PROVIDER_SITE_OTHER): Payer: 59 | Admitting: Surgery

## 2022-11-23 VITALS — BP 108/68 | HR 77 | Temp 98.0°F | Ht 61.0 in | Wt 199.0 lb

## 2022-11-23 DIAGNOSIS — Z803 Family history of malignant neoplasm of breast: Secondary | ICD-10-CM

## 2022-11-23 DIAGNOSIS — R928 Other abnormal and inconclusive findings on diagnostic imaging of breast: Secondary | ICD-10-CM | POA: Diagnosis not present

## 2022-11-23 NOTE — Patient Instructions (Addendum)
We will send a referral to the [facility] for them to speak with you about Genetic Counseling. They will call you to schedule this appointment.   Continue with Dr Carlynn Purl for annual mammograms.    Follow-up with our office as needed.  Please call and ask to speak with a nurse if you develop questions or concerns.    Breast Self-Awareness Breast self-awareness is knowing how your breasts look and feel. You need to: Check your breasts on a regular basis. Tell your doctor about any changes. Become familiar with the look and feel of your breasts. This can help you catch a breast problem while it is still small and can be treated. You should do breast self-exams even if you have breast implants. What you need: A mirror. A well-lit room. A pillow or other soft object. How to do a breast self-exam Follow these steps to do a breast self-exam: Look for changes  Take off all the clothes above your waist. Stand in front of a mirror in a room with good lighting. Put your hands down at your sides. Compare your breasts in the mirror. Look for any difference between them, such as: A difference in shape. A difference in size. Wrinkles, dips, and bumps in one breast and not the other. Look at each breast for changes in the skin, such as: Redness. Scaly areas. Skin that has gotten thicker. Dimpling. Open sores (ulcers). Look for changes in your nipples, such as: Fluid coming out of a nipple. Fluid around a nipple. Bleeding. Dimpling. Redness. A nipple that looks pushed in (retracted), or that has changed position. Feel for changes Lie on your back. Feel each breast. To do this: Pick a breast to feel. Place a pillow under the shoulder closest to that breast. Put the arm closest to that breast behind your head. Feel the nipple area of that breast using the hand of your other arm. Feel the area with the pads of your three middle fingers by making small circles with your  fingers. Use light, medium, and firm pressure. Continue the overlapping circles, moving downward over the breast. Keep making circles with your fingers. Stop when you feel your ribs. Start making circles with your fingers again, this time going upward until you reach your collarbone. Then, make circles outward across your breast and into your armpit area. Squeeze your nipple. Check for discharge and lumps. Repeat these steps to check your other breast. Sit or stand in the tub or shower. With soapy water on your skin, feel each breast the same way you did when you were lying down. Write down what you find Writing down what you find can help you remember what to tell your doctor. Write down: What is normal for each breast. Any changes you find in each breast. These include: The kind of changes you find. A tender or painful breast. Any lump you find. Write down its size and where it is. When you last had your monthly period (menstrual cycle). General tips If you are breastfeeding, the best time to check your breasts is after you feed your baby or after you use a breast pump. If you get monthly bleeding, the best time to check your breasts is 5-7 days after your monthly cycle ends. With time, you will become comfortable with the self-exam. You will also start to know if there are changes in your breasts. Contact a doctor if: You see a change in the shape or size of your  breasts or nipples. You see a change in the skin of your breast or nipples, such as red or scaly skin. You have fluid coming from your nipples that is not normal. You find a new lump or thick area. You have breast pain. You have any concerns about your breast health. Summary Breast self-awareness includes looking for changes in your breasts and feeling for changes within your breasts. You should do breast self-awareness in front of a mirror in a well-lit room. If you get monthly periods (menstrual cycles), the best time to  check your breasts is 5-7 days after your period ends. Tell your doctor about any changes you see in your breasts. Changes include changes in size, changes on the skin, painful or tender breasts, or fluid from your nipples that is not normal. This information is not intended to replace advice given to you by your health care provider. Make sure you discuss any questions you have with your health care provider. Document Revised: 01/06/2022 Document Reviewed: 06/03/2021 Elsevier Patient Education  2023 [employer].

## 2022-11-23 NOTE — Progress Notes (Signed)
11/23/2022  Reason for Visit:  Left breast distortion and family history of breast cancer  Requesting Provider:  Alba Cory, MD  History of Present Illness: Cynthia Villa is a 42 y.o. female presenting for evalution of a left breast distortion.  The patient has a strong family history of breast cancer including her mother, maternal aunt, and cousin.  There is also history of other types of cancer in her family.  She does self breast exams, and two years ago she palpated an area in the lateral left breast that she describes as a "ridge".  She had a mammogram on 11/12/20 which found a probably benign asymmetry in the inferior left breast as well as a cluster of cysts in the right breast.  Follow up mammogram on 11/17/21 showed a stable asymmetry in the left breast and cluster or cysts in the right breast.  Yesterday she had another mammogram and ultrasound showing again stable asymmetry in the lateral left breast consistent with fibroglandular tissue.  Today, the patient thought she was coming in for genetic counseling and was uncertain about surgical consult.  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 Authorizing Provider  albuterol (PROVENTIL) (2.5 MG/3ML) 0.083% nebulizer solution Take 3 mLs (2.5 mg total) by nebulization every 6 (six) hours as needed for wheezing or shortness of breath. 07/17/18  Yes Poulose, Percell Belt, NP  Azelastine-Fluticasone 137-50 MCG/ACT SUSP SHAKE LIQUID AND USE 2 SPRAYS IN EACH NOSTRIL DAILY 12/03/18  Yes Poulose, Percell Belt, NP  EPINEPHrine 0.3 mg/0.3 mL IJ SOAJ injection Inject into the muscle as directed. 05/21/20  Yes [provider]  levocetirizine  (XYZAL) 5 MG tablet Take 1 tablet (5 mg total) by mouth daily. Patient taking differently: Take 5 mg by mouth as needed. 08/03/15  Yes Alba Cory, MD  [facility] INTRAUTERINE COPPER IUD IUD by Intrauterine route.   Yes [provider]  tacrolimus (PROTOPIC) 0.1 % ointment Apply topically as needed. 05/21/20  Yes [provider]  triamcinolone cream (KENALOG) 0.1 % Apply 1 application topically 2 (two) times daily. Patient taking differently: Apply 1 application  topically as needed. 02/14/18  Yes Doren Custard, FNP    Allergies: Allergies  Allergen Reactions   Cetirizine Other (See Comments)    Other Reaction: CNS Disorder   Other Anaphylaxis    Mammal products   Dog Epithelium    Monistat  [Miconazole] Nausea And Vomiting   Orange Fruit [Citrus] Swelling   Penicillins Hives   Permethrin     rash   Sudafed  [Pseudoephedrine]     Other reaction(s): Muscle Pain   Banana Rash   Pineapple Rash    Social History:  reports that she has never smoked. She has never used smokeless tobacco. She reports that she does not drink alcohol and does not use drugs.   Family History: Family History  Problem Relation Age of Onset   Diabetes Mother        DM-2nd to chronic pancreatitis   Alcohol abuse Mother    Cancer Mother        Breast   Cholelithiasis Mother    Lung cancer Mother  Tobacco smoker   COPD Mother    Breast cancer Mother 23   Sleep apnea Father    Hypertension Father    Polycystic ovary syndrome Sister    Cholelithiasis Sister    COPD Maternal Grandmother    Emphysema Maternal Grandmother        Tobacco smoker   Prostate cancer Paternal Grandfather    PKU Son    Cancer Maternal Aunt 50       breast camcer   Breast cancer Maternal Aunt 50   Cancer Paternal Aunt    Cancer Cousin 60 - 49       breast cancer   Breast cancer Cousin        late 30's    Review of Systems: Review of Systems  Constitutional:  Negative for chills and fever.   HENT:  Negative for hearing loss.   Respiratory:  Negative for shortness of breath.   Cardiovascular:  Negative for chest pain.  Gastrointestinal:  Negative for nausea and vomiting.  Genitourinary:  Negative for dysuria.  Musculoskeletal:  Negative for myalgias.  Skin:  Negative for rash.  Neurological:  Negative for dizziness.  Psychiatric/Behavioral:  Negative for depression.     Physical Exam BP 108/68   Pulse 77   Temp 98 F (36.7 C)   Ht 5\' 1"  (1.549 m)   Wt 199 lb (90.3 kg)   LMP 10/06/2022 (Exact Date) Comment: iud  SpO2 98%   BMI 37.60 kg/m  CONSTITUTIONAL: No acute distress HEENT:  Normocephalic, atraumatic, extraocular motion intact. NECK: Trachea is midline, and there is no jugular venous distension.  RESPIRATORY:  Lungs are clear, and breath sounds are equal bilaterally. Normal respiratory effort without pathologic use of accessory muscles. CARDIOVASCULAR: Heart is regular without murmurs, gallops, or rubs. BREAST:  Left breast has an area of very subtle abnormality that feels like a fold of tissue in the lateral aspect, though no true mass noted.  No skin changes, nipple changes.  No left axillary lymphadenopathy.  Right breast without palpable masses, skin changes, or nipple changes.  No right axillary lymphadenopathy. MUSCULOSKELETAL:  Normal muscle strength and tone in all four extremities.  No peripheral edema or cyanosis. SKIN: Skin turgor is normal. There are no pathologic skin lesions.  NEUROLOGIC:  Motor and sensation is grossly normal.  Cranial nerves are grossly intact. PSYCH:  Alert and oriented to person, place and time. Affect is normal.  Laboratory Analysis: Labs from 10/28/22: Na 142, K 4.4, Cl 108, CO2 24, BUN 9, Cr 0.89.  LFTs within normal.  WBC 6.8, Hgb 13.5, Hct 40.7, Plt 235.  HgA1c 5.9  Imaging: Mammogram and Ultrasound on 11/22/22: FINDINGS: RIGHT BREAST:   Mammogram: RIGHT breast is negative. Mammographic images were processed with CAD.    LEFT BREAST:   Mammogram: There is stable appearance of asymmetry in the LATERAL portion of the LEFT breast. A possible mass in the MEDIAL portion of the LEFT breast is confirmed on spot compression views. These views demonstrate a partially obscured oval mass in the MEDIAL aspect of the LEFT breast. Mammographic images were processed with CAD.   Ultrasound: Targeted ultrasound is performed, showing a group of benign cysts in the 9 o'clock location of the LEFT breast 7 centimeters from the nipple measuring 0.8 x 0.4 x 0.7 centimeters. No solid component or areas of acoustic shadowing are present.   IMPRESSION: 1.  No mammographic or ultrasound evidence for malignancy. 2. Long-term stability of asymmetry in the LATERAL LEFT breast,  consistent with benign fibroglandular tissue. 3. Benign group of cysts in the 1 o'clock location of the LEFT breast. 4. Strong family history of breast cancer   RECOMMENDATION: 1. Recommend screening mammogram in 1 year. 2. Recommend genetic consultation with Dr. Aleen CampiPiscoya, scheduled for tomorrow. 3. Based on the recommendations of the American Cancer Society, annual screening MRI is suggested in addition to annual mammography if the patient has an estimated lifetime risk of developing breast cancer which is greater than 20%.   I have discussed the findings and recommendations with the patient. If applicable, a reminder letter will be sent to the patient regarding the next appointment.   BI-RADS CATEGORY  2: Benign.    Assessment and Plan: This is a 42 y.o. female with a stable left breast asymmetry and strong family history of breast cancer.  --Discussed with the patient that since the area of asymmetry is stable over two years, there was no need for biopsy and currently no need for any surgical excision.  The other areas found in either breast have been breast cysts.  The patient reports that 10 years ago her mother was tested for BRCA gene and was  negative.  She has not been tested.  There is also other types of cancer that run in the family.   --Discussed with the patient that the first thing to do would be to refer her to genetic counseling, which is not done by me. This will help assess her risk and obtain genetic testing given her strong history not just of breast cancer but also other cancers.  Then based on this we can determine what, if any, surgical plan we could recommend.  At least if anything, she would need yearly mammogram and I agree with yearly bilateral breast MRI.  Discussed that typically these can get staggered so there's an imaging test every 6 months.  These can be arranged through her PCP. --Discussed with her that if her risk of breast cancer is high or if she tests positive for any genes that increase her risk, we could discuss the option for prophylactic mastectomies, which would also give her the option for immediate breast reconstruction. --For now, will contact the patient once the genetic testing/counseling is done so we can discuss further what should or does not need to be done.  I spent 40 minutes dedicated to the care of this patient on the date of this encounter to include pre-visit review of records, face-to-face time with the patient discussing diagnosis and management, and any post-visit coordination of care.   Howie IllJose Luis Bekki Tavenner, MD [facility]

## 2022-12-13 ENCOUNTER — Encounter: Payer: Self-pay | Admitting: Family Medicine

## 2022-12-15 ENCOUNTER — Inpatient Hospital Stay: Payer: 59

## 2022-12-15 ENCOUNTER — Inpatient Hospital Stay: Payer: 59 | Attending: Oncology | Admitting: Licensed Clinical Social Worker

## 2022-12-15 DIAGNOSIS — Z8042 Family history of malignant neoplasm of prostate: Secondary | ICD-10-CM | POA: Diagnosis not present

## 2022-12-15 DIAGNOSIS — Z800 Family history of malignant neoplasm of digestive organs: Secondary | ICD-10-CM

## 2022-12-15 DIAGNOSIS — Z803 Family history of malignant neoplasm of breast: Secondary | ICD-10-CM

## 2022-12-15 NOTE — Progress Notes (Signed)
REFERRING PROVIDER: Henrene Dodge, MD [address] [place],  [place] [address]  PRIMARY PROVIDER:  Alba Cory, MD  PRIMARY REASON FOR VISIT:  1. Family history of breast cancer   2. Family history of stomach cancer   3. Family history of prostate cancer      HISTORY OF PRESENT ILLNESS:   Cynthia Villa, a 42 y.o. female, was seen for a [place] cancer genetics consultation at the request of Dr. Aleen Campi due to a family history of breast cancer.  Cynthia Villa presents to clinic today to discuss the possibility of a hereditary predisposition to cancer, genetic testing, and to further clarify her future cancer risks, as well as potential cancer risks for family members.    CANCER HISTORY:  Cynthia Villa is a 42 y.o. female with no personal history of cancer.    RISK FACTORS:  Menarche was at age 58.  First live birth at age 30.  Ovaries intact: yes.  Hysterectomy: no.  Menopausal status: premenopausal.  Colonoscopy: n/a Mammogram within the last year: yes. Number of breast biopsies: 0. Up to date with pelvic exams: yes.  Past Medical History:  Diagnosis Date   Allergic eczema    Allergy    Exposure to TB     Past Surgical History:  Procedure Laterality Date   WRIST FRACTURE SURGERY Left 09/08/09   Repaired- August 2004    FAMILY HISTORY:  We obtained a detailed, 4-generation family history.  Significant diagnoses are listed below: Family History  Problem Relation Age of Onset   Diabetes Mother        DM-2nd to chronic pancreatitis   Alcohol abuse Mother    Cancer Mother        Breast   Cholelithiasis Mother    Lung cancer Mother        Tobacco smoker   COPD Mother    Breast cancer Mother 47   Sleep apnea Father    Hypertension Father    Polycystic ovary syndrome Sister    Cholelithiasis Sister    COPD Maternal Grandmother    Emphysema Maternal Grandmother        Tobacco smoker   Prostate cancer Paternal Grandfather    PKU Son    Cancer Maternal  Aunt 50       breast camcer   Breast cancer Maternal Aunt 50   Cancer Paternal Aunt    Cancer Cousin 72 - 49       breast cancer   Breast cancer Cousin        late 58's   Cynthia Villa has 2 sons, 1 daughter, and 2 adopted daughters. They all have PKU. Patient has 2 maternal half sisters, both have celiac disease. No cancers.  Cynthia Villa mother had breast cancer at 37, no genetic testing and does not want genetic testing. She is living at 20. A maternal aunt also had breast cancer over age 31, and her daughter had breast cancer in her 55s.Maternal grandfather had bone cancer.   Cynthia Villa father is living at 62, no history of cancer. A paternal cousin had stomach cancer and died in his teens. Paternal grandfather had prostate cancer.  Cynthia Villa is unaware of previous family history of genetic testing for hereditary cancer risks. There is no reported Ashkenazi Jewish ancestry. There is no known consanguinity.    GENETIC COUNSELING ASSESSMENT: Cynthia Villa is a 42 y.o. female with a family history of breast cancer which is somewhat suggestive of a hereditary cancer  syndrome and predisposition to cancer. We, therefore, discussed and recommended the following at today's visit.   DISCUSSION: We discussed that approximately 10% of breast cancer is hereditary. Most cases of hereditary breast cancer are associated with BRCA1/BRCA2 genes, although there are other genes associated with hereditary cancer as well. Cancers and risks are gene specific. We discussed that testing is beneficial for several reasons including knowing about cancer risks, identifying potential screening and risk-reduction options that may be appropriate, and to understand if other family members could be at risk for cancer and allow them to undergo genetic testing.   We reviewed the characteristics, features and inheritance patterns of hereditary cancer syndromes. We also discussed genetic testing, including the appropriate family members  to test, the process of testing, insurance coverage and turn-around-time for results. We discussed the implications of a negative, positive and/or variant of uncertain significant result. We recommended Cynthia Villa pursue genetic testing for the [facility] Multi-Cancer+RNA gene panel.   Based on Cynthia Villa's family history of cancer, she meets medical criteria for genetic testing. Despite that she meets criteria, she may still have an out of pocket cost.   We discussed that some people do not want to undergo genetic testing due to fear of genetic discrimination.  A federal law called the Genetic Information Non-Discrimination Act (GINA) of 2008 helps protect individuals against genetic discrimination based on their genetic test results.  It impacts both health insurance and employment.  For health insurance, it protects against increased premiums, being kicked off insurance or being forced to take a test in order to be insured.  For employment it protects against hiring, firing and promoting decisions based on genetic test results.  Health status due to a cancer diagnosis is not protected under GINA.  This law does not protect life insurance, disability insurance, or other types of insurance.   PLAN: After considering the risks, benefits, and limitations, Cynthia Villa provided informed consent to pursue genetic testing and the blood sample was sent to [facility] for analysis of the Multi-Cancer+RNA panel. Results should be available within approximately 2-3 weeks' time, at which point they will be disclosed by telephone to Cynthia Villa, as will any additional recommendations warranted by these results. Cynthia Villa will receive a summary of her genetic counseling visit and a copy of her results once available. This information will also be available in Epic.   Cynthia Villa questions were answered to her satisfaction today. Our contact information was provided should additional questions or concerns arise. Thank you for  the referral and allowing [place] to share in the care of your patient.   Lacy Duverney, MS, [facility] Genetic Counselor [email or web address] Phone: [phone]  The patient was seen for a total of 30 minutes in face-to-face genetic counseling.  Dr. Orlie Dakin was available for discussion regarding this case.   _______________________________________________________________________ For Office Staff:  Number of people involved in session: 1 Was an Intern/ student involved with case: no

## 2022-12-27 ENCOUNTER — Encounter: Payer: Self-pay | Admitting: Licensed Clinical Social Worker

## 2022-12-27 ENCOUNTER — Ambulatory Visit: Payer: Self-pay | Admitting: Licensed Clinical Social Worker

## 2022-12-27 ENCOUNTER — Telehealth: Payer: Self-pay | Admitting: Licensed Clinical Social Worker

## 2022-12-27 DIAGNOSIS — Z1379 Encounter for other screening for genetic and chromosomal anomalies: Secondary | ICD-10-CM | POA: Insufficient documentation

## 2022-12-27 NOTE — Progress Notes (Signed)
HPI:   [name] was previously seen in the [place] [facility] due to a family history of breast cancer and concerns regarding a hereditary predisposition to cancer. Please refer to our prior cancer genetics clinic note for more information regarding our discussion, assessment and recommendations, at the time. [name] recent genetic test results were disclosed to her, as were recommendations warranted by these results. These results and recommendations are discussed in more detail below.  CANCER HISTORY:  Oncology History   No history exists.    FAMILY HISTORY:  We obtained a detailed, 4-generation family history.  Significant diagnoses are listed below: Family History  Problem Relation Age of Onset   Diabetes Mother        DM-2nd to chronic pancreatitis   Alcohol abuse Mother    Cancer Mother        Breast   Cholelithiasis Mother    Lung cancer Mother        Tobacco smoker   COPD Mother    Breast cancer Mother 64   Sleep apnea Father    Hypertension Father    Polycystic ovary syndrome Sister    Cholelithiasis Sister    COPD Maternal Grandmother    Emphysema Maternal Grandmother        Tobacco smoker   Prostate cancer Paternal Grandfather    Cynthia Villa    Cancer Maternal Aunt 50       breast camcer   Breast cancer Maternal Aunt 50   Cancer Paternal Aunt    Cancer Cousin 49 - 49       breast cancer   Breast cancer Cousin        late 69's    [name] has 2 sons, 1 daughter, and 2 adopted daughters. They all have Cynthia. Patient has 2 maternal half sisters, both have celiac disease. No cancers.   [name] mother had breast cancer at 21, no genetic testing and does not want genetic testing. She is living at 6. A maternal aunt also had breast cancer over age 80, and her daughter had breast cancer in her 41s.Maternal grandfather had bone cancer.    [name] father is living at 2, no history of cancer. A paternal cousin had stomach cancer and died in his teens.  Paternal grandfather had prostate cancer.   [name] is unaware of previous family history of genetic testing for hereditary cancer risks. There is no reported Ashkenazi Jewish ancestry. There is no known consanguinity.     GENETIC TEST RESULTS:  The [facility] Multi-Cancer+RNA Panel found no pathogenic mutations.   The Multi-Cancer + RNA Panel offered by [facility] includes sequencing and/or deletion/duplication analysis of the following 70 genes:  AIP*, ALK, APC*, ATM*, AXIN2*, BAP1*, BARD1*, BLM*, BMPR1A*, BRCA1*, BRCA2*, BRIP1*, CDC73*, CDH1*, CDK4, CDKN1B*, CDKN2A, CHEK2*, CTNNA1*, DICER1*, EPCAM, EGFR, FH*, FLCN*, GREM1, HOXB13, KIT, LZTR1, MAX*, MBD4, MEN1*, MET, MITF, MLH1*, MSH2*, MSH3*, MSH6*, MUTYH*, NF1*, NF2*, NTHL1*, PALB2*, PDGFRA, PMS2*, POLD1*, POLE*, POT1*, PRKAR1A*, PTCH1*, PTEN*, RAD51C*, RAD51D*, RB1*, RET, SDHA*, SDHAF2*, SDHB*, SDHC*, SDHD*, SMAD4*, SMARCA4*, SMARCB1*, SMARCE1*, STK11*, SUFU*, TMEM127*, TP53*, TSC1*, TSC2*, VHL*. RNA analysis is performed for * genes.   The test report has been scanned into EPIC and is located under the Molecular Pathology section of the Results Review tab.  A portion of the result report is included below for reference. Genetic testing reported out on 12/23/2022.      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  At this time, it is unknown if this variant is associated with an increased risk for cancer or if it is benign, but most uncertain variants are reclassified to benign. It should not be used to make medical management decisions. With time, we suspect the laboratory will determine the significance of this variant, if any. If the laboratory reclassifies this variant, we will attempt to contact [name] to discuss it further.   Even though a pathogenic variant was not identified, possible explanations for the cancer in the family may include: There may be no hereditary risk for cancer in the  family. The cancers in [name] and/or her family may be sporadic/familial or due to other genetic and environmental factors. There may be a gene mutation in one of these genes that current testing methods cannot detect but that chance is small. There could be another gene that has not yet been discovered, or that we have not yet tested, that is responsible for the cancer diagnoses in the family.  It is also possible there is a hereditary cause for the cancer in the family that [name] did not inherit Therefore, it is important to remain in touch with cancer genetics in the future so that we can continue to offer [name] the most up to date genetic testing.   ADDITIONAL GENETIC TESTING:  We discussed with [name] that her genetic testing was fairly extensive.  If there are additional relevant genes identified to increase cancer risk that can be analyzed in the future, we would be happy to discuss and coordinate this testing at that time.    CANCER SCREENING RECOMMENDATIONS:  [name] test result is considered negative (normal).  This means that we have not identified a hereditary cause for her family history of cancer at this time.   An individual's cancer risk and medical management are not determined by genetic test results alone. Overall cancer risk assessment incorporates additional factors, including personal medical history, family history, and any available genetic information that may result in a personalized plan for cancer prevention and surveillance. Therefore, it is recommended she continue to follow the cancer management and screening guidelines provided by her  primary healthcare provider.  Based on [name]'s personal and family history of cancer as well as her genetic test results, risk model Rockne Menghini was used to estimate her risk of developing breast cancer. This estimates her lifetime risk of developing breast cancer to be approximately 16%.  The patient's lifetime breast  cancer risk is a preliminary estimate based on available information using one of several models endorsed by the [employer] (NCCN). The NCCN recommends consideration of breast MRI screening as an adjunct to mammography for patients at high risk (defined as 20% or greater lifetime risk).  This risk estimate can change over time and may be repeated to reflect new information in her personal or family history in the future.    RECOMMENDATIONS FOR FAMILY MEMBERS:   Since she did not inherit a identifiable mutation in a cancer predisposition gene included on this panel, her children could not have inherited a known mutation from her in one of these genes. Individuals in this family might be at some increased risk of developing cancer, over the general population risk, due to the family history of cancer.  Individuals in the family should notify their providers of the family history of cancer. We recommend women in this family have a yearly mammogram beginning at  age 16, or 10 years younger than the earliest onset of cancer, an annual clinical breast exam, and perform monthly breast self-exams.  Family members should have colonoscopies by at age 89, or earlier, as recommended by their providers. Other members of the family may still carry a pathogenic variant in one of these genes that [name] did not inherit. Based on the family history, we recommend her mother/maternal relatives, and potentially those related to her paternal cousin who had stomach cancer, have genetic counseling and testing. [name] will let us know if we can be of any assistance in coordinating genetic counseling and/or testing for this family member.   We do not recommend familial testing for the APC/MET variant of uncertain significance (VUS).  FOLLOW-UP:  Lastly, we discussed with [name] that cancer genetics is a rapidly advancing field and it is possible that new genetic tests will be appropriate for her  and/or her family members in the future. We encouraged her to remain in contact with cancer genetics on an annual basis so we can update her personal and family histories and let her know of advances in cancer genetics that may benefit this family.   Our contact number was provided. [name] questions were answered to her satisfaction, and she knows she is welcome to call us at anytime with additional questions or concerns.    Cynthia Duverney, MS, [facility] Genetic Counselor [email or web address] Phone: [phone]

## 2022-12-27 NOTE — Telephone Encounter (Signed)
I contacted Cynthia Villa to discuss her genetic testing results. No pathogenic variants were identified in the 70 genes analyzed. Detailed clinic note to follow.   The test report has been scanned into EPIC and is located under the Molecular Pathology section of the Results Review tab.  A portion of the result report is included below for reference.      Cynthia Duverney, MS, [facility] Genetic Counselor [email or web address] Phone: [phone]

## 2023-07-07 ENCOUNTER — Ambulatory Visit: Payer: 59 | Admitting: Physician Assistant

## 2023-10-31 ENCOUNTER — Encounter: Payer: 59 | Admitting: Family Medicine

## 2023-11-01 ENCOUNTER — Encounter: Payer: 59 | Admitting: Family Medicine

## 2023-11-22 ENCOUNTER — Encounter: Payer: Self-pay | Admitting: Family Medicine

## 2023-12-15 ENCOUNTER — Ambulatory Visit (INDEPENDENT_AMBULATORY_CARE_PROVIDER_SITE_OTHER): Payer: Self-pay | Admitting: Family Medicine

## 2023-12-15 ENCOUNTER — Encounter: Payer: Self-pay | Admitting: Family Medicine

## 2023-12-15 VITALS — BP 128/80 | HR 94 | Resp 16 | Ht 61.0 in | Wt 198.2 lb

## 2023-12-15 DIAGNOSIS — R7303 Prediabetes: Secondary | ICD-10-CM | POA: Diagnosis not present

## 2023-12-15 DIAGNOSIS — Z79899 Other long term (current) drug therapy: Secondary | ICD-10-CM

## 2023-12-15 DIAGNOSIS — Z8639 Personal history of other endocrine, nutritional and metabolic disease: Secondary | ICD-10-CM | POA: Diagnosis not present

## 2023-12-15 DIAGNOSIS — Z0000 Encounter for general adult medical examination without abnormal findings: Secondary | ICD-10-CM | POA: Diagnosis not present

## 2023-12-15 DIAGNOSIS — Z803 Family history of malignant neoplasm of breast: Secondary | ICD-10-CM

## 2023-12-15 DIAGNOSIS — Z789 Other specified health status: Secondary | ICD-10-CM

## 2023-12-15 DIAGNOSIS — Z13220 Encounter for screening for lipoid disorders: Secondary | ICD-10-CM

## 2023-12-15 DIAGNOSIS — Z1231 Encounter for screening mammogram for malignant neoplasm of breast: Secondary | ICD-10-CM

## 2023-12-15 NOTE — Progress Notes (Signed)
 Name: Cynthia Villa   MRN: [id number]    DOB: [birth year]   Date:12/15/2023       Progress Note  Subjective  Chief Complaint  Chief Complaint  Patient presents with   Annual Exam    HPI  Patient presents for annual CPE.  Diet: vegan  Exercise: continue regular physical activity   Last Eye Exam: completed Last Dental Exam: completed  Flowsheet Row Office Visit from 12/15/2023 in [facility]  AUDIT-C Score 0      Depression: Phq 9 is  negative    12/15/2023    2:48 PM 10/28/2022    9:11 AM 10/29/2021   10:02 AM 10/23/2020    3:35 PM 05/04/2018   12:28 PM  Depression screen PHQ 2/9  Decreased Interest 0 0 0 0 0  Down, Depressed, Hopeless 0 0 0 0 0  PHQ - 2 Score 0 0 0 0 0  Altered sleeping 0 0 0 0 0  Tired, decreased energy 0 0 0 1 1  Change in appetite 0 0 0 0 0  Feeling bad or failure about yourself  0 0 0 0 0  Trouble concentrating 0 0 0 0 0  Moving slowly or fidgety/restless 0 0 0 0 0  Suicidal thoughts 0 0 0 0 0  PHQ-9 Score 0 0 0 1 1  Difficult doing work/chores Not difficult at all  Not difficult at all Not difficult at all Not difficult at all   Hypertension: BP Readings from Last 3 Encounters:  12/15/23 128/80  11/23/22 108/68  10/28/22 126/70   Obesity: Wt Readings from Last 3 Encounters:  12/15/23 198 lb 3.2 oz (89.9 kg)  11/23/22 199 lb (90.3 kg)  10/28/22 202 lb 12.8 oz (92 kg)   BMI Readings from Last 3 Encounters:  12/15/23 37.45 kg/m  11/23/22 37.60 kg/m  10/28/22 38.32 kg/m     Vaccines: reviewed with the patient.   Hep C Screening: completed STD testing and prevention (HIV/chl/gon/syphilis): N/A Intimate partner violence: negative screen  Sexual History : one partner, married  Menstrual History/LMP/Abnormal Bleeding: she has an IUD Discussed importance of follow up if any post-menopausal bleeding: not applicable  Incontinence Symptoms: negative for symptoms   Breast cancer:  - Last Mammogram: due for repeat and  MRI  - BRCA gene screening: genetic test negative  Osteoporosis Prevention : Discussed high calcium and vitamin D  supplementation, weight bearing exercises Bone density :not applicable   Cervical cancer screening: up-to-date  Skin cancer: Discussed monitoring for atypical lesions  Colorectal cancer: not due at age 89    Lung cancer:  Low Dose CT Chest recommended if Age 86-80 years, 20 pack-year currently smoking OR have quit w/in 15years. Patient does not qualify for screen   ECG: N/A  Advanced Care Planning: A voluntary discussion about advance care planning including the explanation and discussion of advance directives.  Discussed health care proxy and Living will, and the patient was able to identify a health care proxy as husband.  Patient does not have a living will and power of attorney of health care   Patient Active Problem List   Diagnosis Date Noted   Genetic testing 12/27/2022   Allergic reaction to alpha-gal 10/29/2021   Allergic rhinitis due to animal (cat) (dog) hair and dander 10/23/2020   Allergic rhinitis due to pollen 10/23/2020   COVID-19 long hauler 10/23/2020   Pre-diabetes 10/23/2020   History of gestational diabetes 11/01/2017   Migraine headache without aura 08/30/2017  Urticaria 09/15/2016   Disorder of mitral valve 03/04/2015   History of thyroid  nodule 03/04/2015   Allergic contact dermatitis 03/03/2015   Allergic to food 03/03/2015   History of hyperthyroidism 06/17/2011   Subclinical hypothyroidism 06/17/2011    Past Surgical History:  Procedure Laterality Date   WRIST FRACTURE SURGERY Left 09/08/09   Repaired- August 2004    Family History  Problem Relation Age of Onset   Diabetes Mother        DM-2nd to chronic pancreatitis   Alcohol abuse Mother    Cancer Mother        Breast   Cholelithiasis Mother    Lung cancer Mother        Tobacco smoker   COPD Mother    Breast cancer Mother 24   Sleep apnea Father    Hypertension Father     Polycystic ovary syndrome Sister    Cholelithiasis Sister    Cholelithiasis Sister    PKU Son    Cancer Maternal Aunt 50       breast camcer   Breast cancer Maternal Aunt 50   Cancer Paternal Aunt    COPD Maternal Grandmother    Emphysema Maternal Grandmother        Tobacco smoker   Prostate cancer Paternal Grandfather    Cancer Cousin 12 - 49       breast cancer   Breast cancer Cousin        late 30's    Social History   Socioeconomic History   Marital status: Married    Spouse name: Saunders Curio   Number of children: 5   Years of education: Not on file   Highest education level: Bachelor's degree (e.g., BA, AB, BS)  Occupational History   Not on file  Tobacco Use   Smoking status: Never   Smokeless tobacco: Never  Vaping Use   Vaping status: Never Used  Substance and Sexual Activity   Alcohol use: No    Alcohol/week: 0.0 standard drinks of alcohol   Drug use: No   Sexual activity: Yes    Partners: Male    Birth control/protection: I.U.D.  Other Topics Concern   Not on file  Social History Narrative   She is married, 3 biological children and 2 adopted children    Social Drivers of Corporate investment banker Strain: Low Risk  (12/15/2023)   Overall Financial Resource Strain (CARDIA)    Difficulty of Paying Living Expenses: Not hard at all  Food Insecurity: No Food Insecurity (12/15/2023)   Hunger Vital Sign    Worried About Running Out of Food in the Last Year: Never true    Ran Out of Food in the Last Year: Never true  Transportation Needs: No Transportation Needs (12/15/2023)   PRAPARE - Administrator, Civil Service (Medical): No    Lack of Transportation (Non-Medical): No  Physical Activity: Sufficiently Active (12/15/2023)   Exercise Vital Sign    Days of Exercise per Week: 4 days    Minutes of Exercise per Session: 50 min  Stress: No Stress Concern Present (12/15/2023)   [employer] of Occupational Health - Occupational Stress Questionnaire     Feeling of Stress : Only a little  Social Connections: Socially Integrated (12/15/2023)   Social Connection and Isolation Panel [NHANES]    Frequency of Communication with Friends and Family: More than three times a week    Frequency of Social Gatherings with Friends and Family: More than three times  a week    Attends Religious Services: More than 4 times per year    Active Member of Clubs or Organizations: Yes    Attends Banker Meetings: More than 4 times per year    Marital Status: Married  Catering manager Violence: Not At Risk (12/15/2023)   Humiliation, Afraid, Rape, and Kick questionnaire    Fear of Current or Ex-Partner: No    Emotionally Abused: No    Physically Abused: No    Sexually Abused: No     Current Outpatient Medications:    Azelastine -Fluticasone  137-50 MCG/ACT SUSP, SHAKE LIQUID AND USE 2 SPRAYS IN EACH NOSTRIL DAILY, Disp: 23 g, Rfl: 3   EPINEPHrine  0.3 mg/0.3 mL IJ SOAJ injection, Inject into the muscle as directed., Disp: , Rfl:    LUTEIN PO, , Disp: , Rfl:    PARAGARD INTRAUTERINE COPPER IUD IUD, by Intrauterine route., Disp: , Rfl:    albuterol  (PROVENTIL ) (2.5 MG/3ML) 0.083% nebulizer solution, Take 3 mLs (2.5 mg total) by nebulization every 6 (six) hours as needed for wheezing or shortness of breath. (Patient not taking: Reported on 12/15/2023), Disp: 150 mL, Rfl: 1   levocetirizine (XYZAL ) 5 MG tablet, Take 1 tablet (5 mg total) by mouth daily. (Patient not taking: Reported on 12/15/2023), Disp: 30 tablet, Rfl: 3   tacrolimus (PROTOPIC) 0.1 % ointment, Apply topically as needed. (Patient not taking: Reported on 12/15/2023), Disp: , Rfl:    triamcinolone  cream (KENALOG ) 0.1 %, Apply 1 application topically 2 (two) times daily. (Patient not taking: Reported on 12/15/2023), Disp: 80 g, Rfl: 2  Allergies  Allergen Reactions   Cetirizine Other (See Comments)    Other Reaction: CNS Disorder   Other Anaphylaxis    Mammal products   Dog Epithelium    Gelatin  Hives and Other (See Comments)   Milk Protein    Monistat  [Miconazole] Nausea And Vomiting   Orange Fruit [Citrus] Swelling   Penicillins Hives   Permethrin     rash   Sudafed  [Pseudoephedrine]     Other reaction(s): Muscle Pain   Banana Rash   Pineapple Rash     ROS  Ten systems reviewed and is negative except as mentioned in HPI    Objective  Vitals:   12/15/23 1454  BP: 128/80  Pulse: 94  Resp: 16  SpO2: 98%  Weight: 198 lb 3.2 oz (89.9 kg)  Height: 5\' 1"  (1.549 m)    Body mass index is 37.45 kg/m.  Physical Exam  Constitutional: Patient appears well-developed and well-nourished. No distress.  HENT: Head: Normocephalic and atraumatic. Ears: B TMs ok, no erythema or effusion; Nose: Nose normal. Mouth/Throat: Oropharynx is clear and moist. No oropharyngeal exudate.  Eyes: Conjunctivae and EOM are normal. Pupils are equal, round, and reactive to light. No scleral icterus.  Neck: Normal range of motion. Neck supple. No JVD present. No thyromegaly present.  Cardiovascular: Normal rate, regular rhythm and normal heart sounds.  No murmur heard. No BLE edema. Pulmonary/Chest: Effort normal and breath sounds normal. No respiratory distress. Abdominal: Soft. Bowel sounds are normal, no distension. There is no tenderness. no masses Breast: no lumps or masses, no nipple discharge or rashes FEMALE GENITALIA:  Not done  RECTAL: not done  Musculoskeletal: Normal range of motion, no joint effusions. No gross deformities Neurological: he is alert and oriented to person, place, and time. No cranial nerve deficit. Coordination, balance, strength, speech and gait are normal.  Skin: atypical moles on back ( needs  to follow up with Dermatologist ) and eczema well controlled, only a small patch on right hand between 4th and 5 th fingers ( controlled with diet) Psychiatric: Patient has a normal mood and affect. behavior is normal. Judgment and thought content normal.     Assessment  & Plan  1. Well adult exam (Primary)  - Lipid panel - CBC with Differential/Platelet - Comprehensive metabolic panel with GFR - TSH - B12 and Folate Panel - Hemoglobin A1c  2. Lipid screening  - Lipid panel  3. Pre-diabetes  - Hemoglobin A1c  4. History of hyperthyroidism  - TSH  5. Long-term use of high-risk medication  - CBC with Differential/Platelet - Comprehensive metabolic panel with GFR  6. Vegan diet  - B12 and Folate Panel   -USPSTF grade A and B recommendations reviewed with patient; age-appropriate recommendations, preventive care, screening tests, etc discussed and encouraged; healthy living encouraged; see AVS for patient education given to patient -Discussed importance of 150 minutes of physical activity weekly, eat two servings of fish weekly, eat one serving of tree nuts ( cashews, pistachios, pecans, almonds.Aaron Aas) every other day, eat 6 servings of fruit/vegetables daily and drink plenty of water and avoid sweet beverages.   -Reviewed Health Maintenance: Yes.

## 2023-12-16 LAB — CBC WITH DIFFERENTIAL/PLATELET
Absolute Lymphocytes: 2215 {cells}/uL (ref 850–3900)
Absolute Monocytes: 429 {cells}/uL (ref 200–950)
Basophils Absolute: 70 {cells}/uL (ref 0–200)
Basophils Relative: 0.9 %
Eosinophils Absolute: 312 {cells}/uL (ref 15–500)
Eosinophils Relative: 4 %
HCT: 42.1 % (ref 35.0–45.0)
Hemoglobin: 13.7 g/dL (ref 11.7–15.5)
MCH: 30.1 pg (ref 27.0–33.0)
MCHC: 32.5 g/dL (ref 32.0–36.0)
MCV: 92.5 fL (ref 80.0–100.0)
MPV: 11.5 fL (ref 7.5–12.5)
Monocytes Relative: 5.5 %
Neutro Abs: 4774 {cells}/uL (ref 1500–7800)
Neutrophils Relative %: 61.2 %
Platelets: 310 10*3/uL (ref 140–400)
RBC: 4.55 10*6/uL (ref 3.80–5.10)
RDW: 13.2 % (ref 11.0–15.0)
Total Lymphocyte: 28.4 %
WBC: 7.8 10*3/uL (ref 3.8–10.8)

## 2023-12-16 LAB — COMPREHENSIVE METABOLIC PANEL WITH GFR
AG Ratio: 1.6 (calc) (ref 1.0–2.5)
ALT: 16 U/L (ref 6–29)
AST: 20 U/L (ref 10–30)
Albumin: 4.2 g/dL (ref 3.6–5.1)
Alkaline phosphatase (APISO): 50 U/L (ref 31–125)
BUN: 7 mg/dL (ref 7–25)
CO2: 28 mmol/L (ref 20–32)
Calcium: 9.1 mg/dL (ref 8.6–10.2)
Chloride: 105 mmol/L (ref 98–110)
Creat: 0.99 mg/dL (ref 0.50–0.99)
Globulin: 2.7 g/dL (ref 1.9–3.7)
Glucose, Bld: 83 mg/dL (ref 65–99)
Potassium: 4.1 mmol/L (ref 3.5–5.3)
Sodium: 142 mmol/L (ref 135–146)
Total Bilirubin: 0.5 mg/dL (ref 0.2–1.2)
Total Protein: 6.9 g/dL (ref 6.1–8.1)
eGFR: 73 mL/min/{1.73_m2} (ref >=60)

## 2023-12-16 LAB — B12 AND FOLATE PANEL
Folate: 9.3 ng/mL
Vitamin B-12: 400 pg/mL (ref 200–1100)

## 2023-12-16 LAB — LIPID PANEL
Cholesterol: 187 mg/dL (ref <200)
HDL: 54 mg/dL (ref >=50)
LDL Cholesterol (Calc): 105 mg/dL — ABNORMAL HIGH
Non-HDL Cholesterol (Calc): 133 mg/dL — ABNORMAL HIGH (ref <130)
Total CHOL/HDL Ratio: 3.5 (calc) (ref <5.0)
Triglycerides: 160 mg/dL — ABNORMAL HIGH (ref <150)

## 2023-12-16 LAB — HEMOGLOBIN A1C
Hgb A1c MFr Bld: 5.7 % — ABNORMAL HIGH (ref <5.7)
Mean Plasma Glucose: 117 mg/dL
eAG (mmol/L): 6.5 mmol/L

## 2023-12-16 LAB — TSH: TSH: 0.76 m[IU]/L

## 2023-12-18 ENCOUNTER — Encounter: Payer: Self-pay | Admitting: Family Medicine

## 2023-12-21 ENCOUNTER — Other Ambulatory Visit: Payer: Self-pay | Admitting: Obstetrics and Gynecology

## 2023-12-21 DIAGNOSIS — Z9189 Other specified personal risk factors, not elsewhere classified: Secondary | ICD-10-CM

## 2024-01-02 ENCOUNTER — Encounter: Payer: Self-pay | Admitting: Obstetrics and Gynecology

## 2024-01-04 ENCOUNTER — Encounter: Payer: Self-pay | Admitting: Obstetrics and Gynecology

## 2024-01-31 ENCOUNTER — Ambulatory Visit
Admission: RE | Admit: 2024-01-31 | Discharge: 2024-01-31 | Disposition: A | Discharge status: Home / Self Care | Source: Ambulatory Visit | Attending: Family Medicine | Admitting: Family Medicine

## 2024-01-31 DIAGNOSIS — Z803 Family history of malignant neoplasm of breast: Secondary | ICD-10-CM | POA: Insufficient documentation

## 2024-01-31 DIAGNOSIS — Z1231 Encounter for screening mammogram for malignant neoplasm of breast: Secondary | ICD-10-CM | POA: Insufficient documentation

## 2024-02-12 ENCOUNTER — Ambulatory Visit
Admission: RE | Admit: 2024-02-12 | Discharge: 2024-02-12 | Disposition: A | Discharge status: Home / Self Care | Source: Ambulatory Visit | Attending: Obstetrics and Gynecology | Admitting: Obstetrics and Gynecology

## 2024-02-12 DIAGNOSIS — Z9189 Other specified personal risk factors, not elsewhere classified: Secondary | ICD-10-CM

## 2024-02-12 MED ORDER — GADOPICLENOL 0.5 MMOL/ML IV SOLN
9.0000 mL | Freq: Once | INTRAVENOUS | Status: AC | PRN
  Administered 2024-02-12: 9 mL via INTRAVENOUS

## 2024-12-16 ENCOUNTER — Encounter: Admitting: Family Medicine
